# Patient Record
Sex: Male | Born: 1985 | Race: White | Hispanic: No | Marital: Married | Smoking: Never smoker
Health system: Southern US, Community
[De-identification: ages and names within clinical notes are randomized; demographics above are authoritative.]

---

## 2011-09-16 ENCOUNTER — Other Ambulatory Visit: Payer: Self-pay | Admitting: Internal Medicine

## 2011-09-16 DIAGNOSIS — R229 Localized swelling, mass and lump, unspecified: Secondary | ICD-10-CM

## 2011-09-18 ENCOUNTER — Other Ambulatory Visit: Payer: Self-pay

## 2011-09-19 ENCOUNTER — Other Ambulatory Visit: Payer: Self-pay

## 2011-09-26 ENCOUNTER — Ambulatory Visit
Admission: RE | Admit: 2011-09-26 | Discharge: 2011-09-26 | Disposition: A | Discharge status: Home / Self Care | Payer: PRIVATE HEALTH INSURANCE | Source: Ambulatory Visit | Attending: Internal Medicine | Admitting: Internal Medicine

## 2011-09-26 DIAGNOSIS — R229 Localized swelling, mass and lump, unspecified: Secondary | ICD-10-CM

## 2013-12-25 IMAGING — US US EXTREM UP*L* LTD
1 series · 14 of 19 positions shown · non-contrast
Comparison: None.

CLINICAL DATA: Probable area in the left upper arm

ULTRASOUND LEFT UPPER EXTREMITY COMPLETE
TECHNIQUE: Ultrasound examination of the left upper arm was
performed including evaluation of the muscles, tendons, joint, and
adjacent soft tissues.

[Series 1: us extrem up*left* ltd · 0.05mm/px · 14 of 19 slices shown]
[im 1/19]
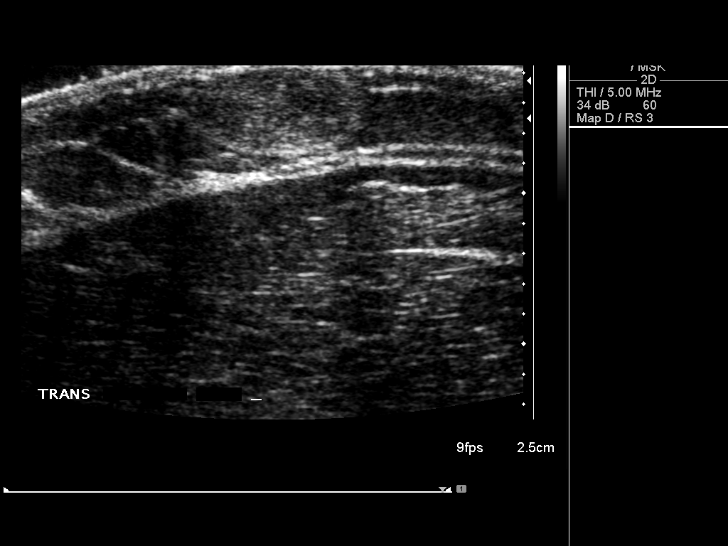
[im 3/19]
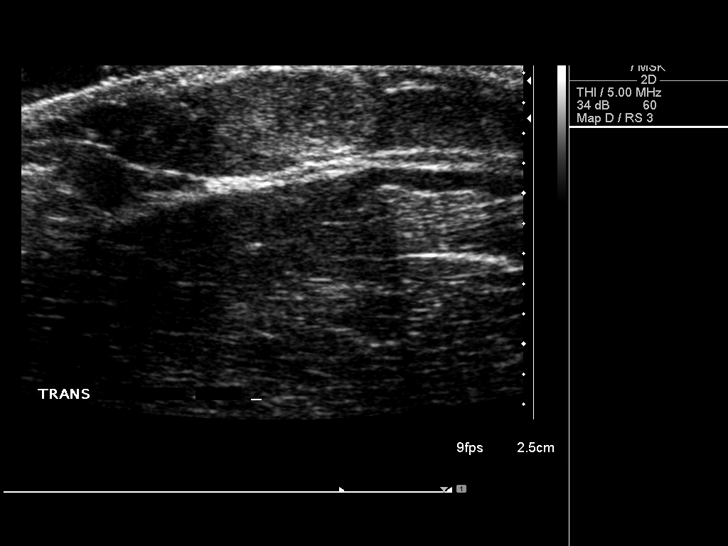
[im 4/19]
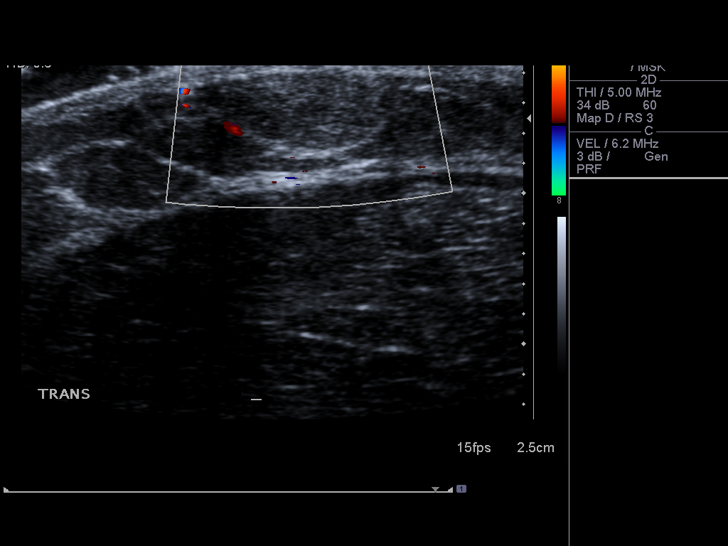
[im 5/19]
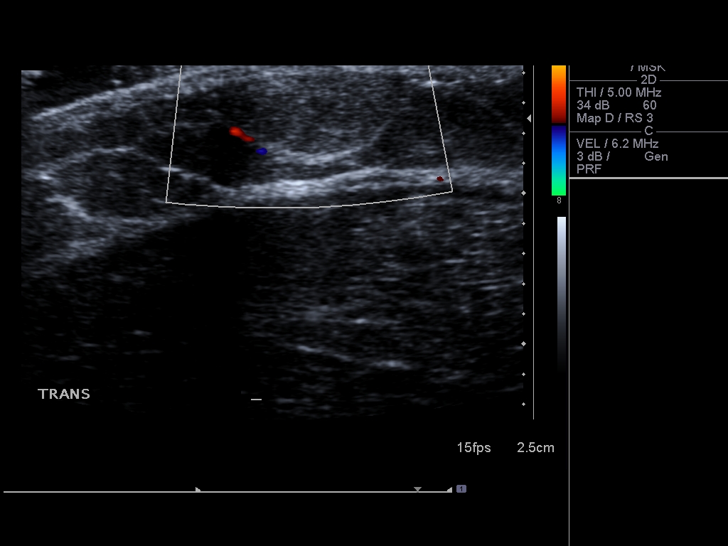
[im 7/19]
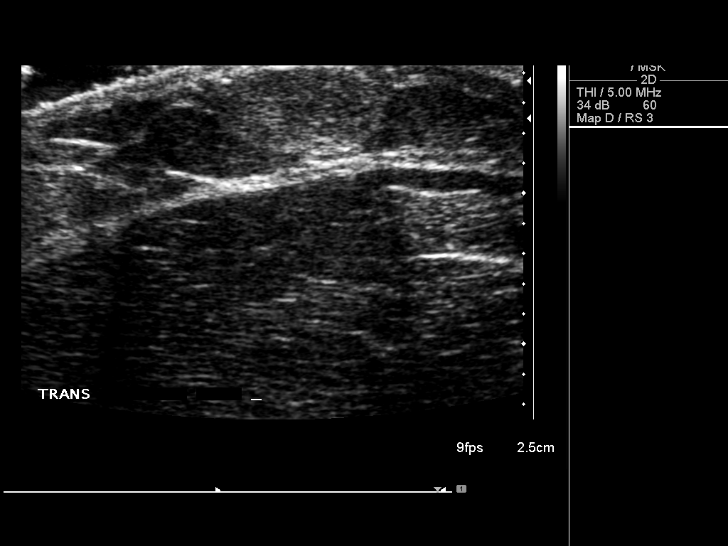
[im 8/19]
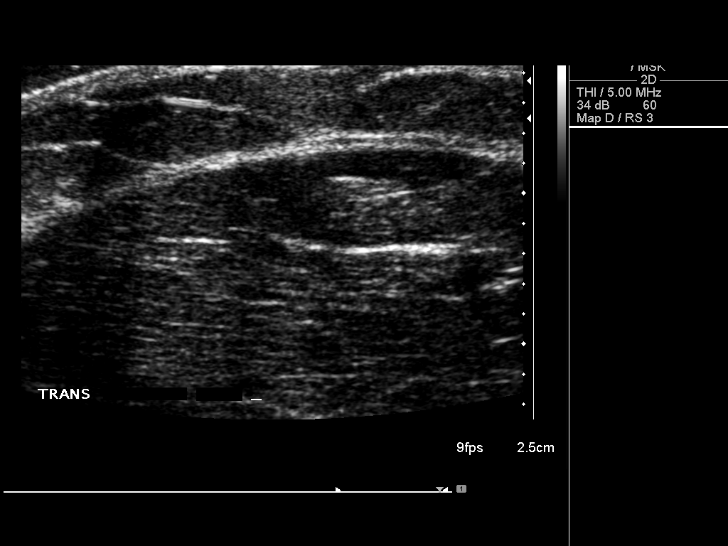
[im 9/19]
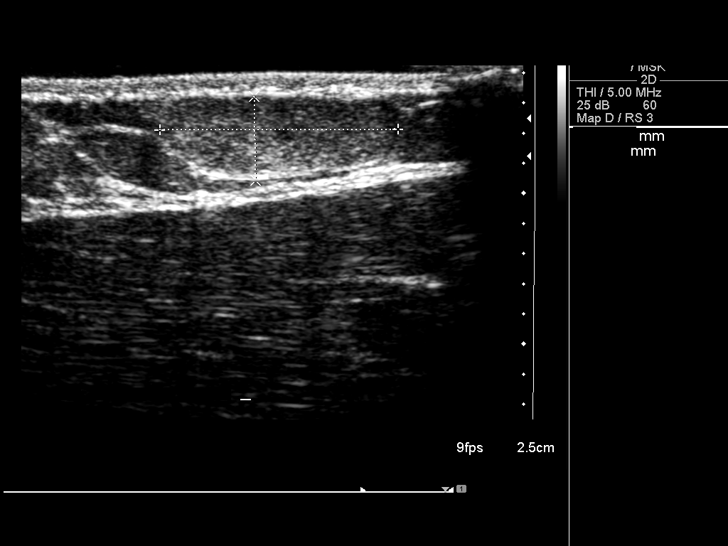
[im 11/19]
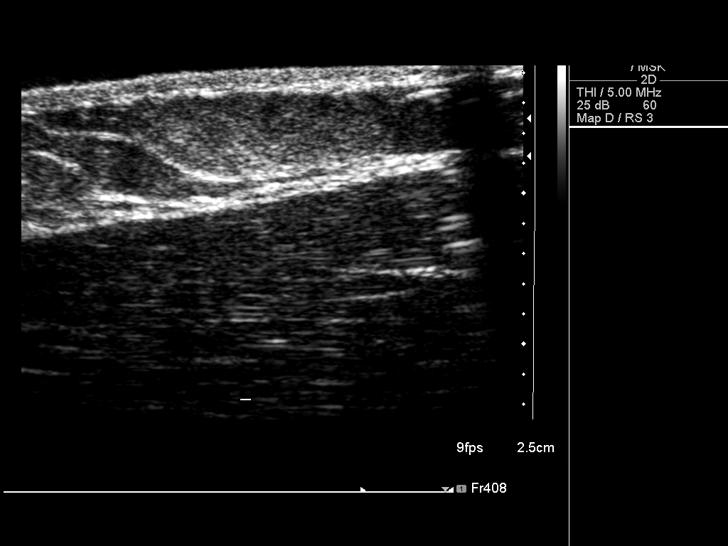
[im 12/19]
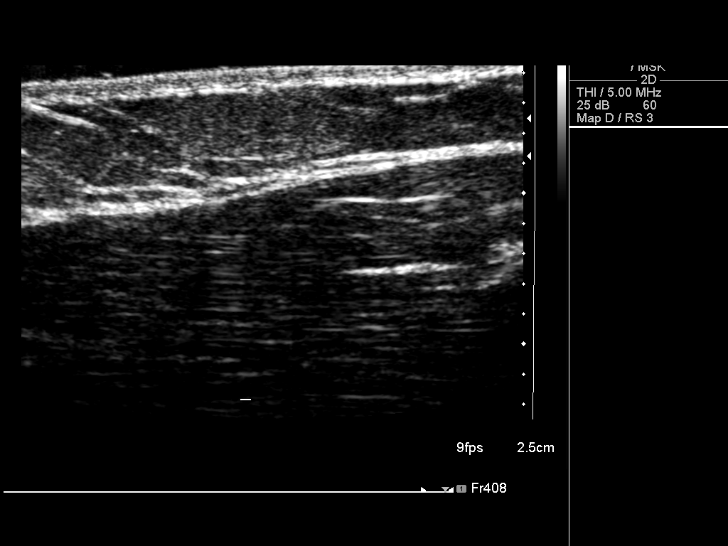
[im 13/19]
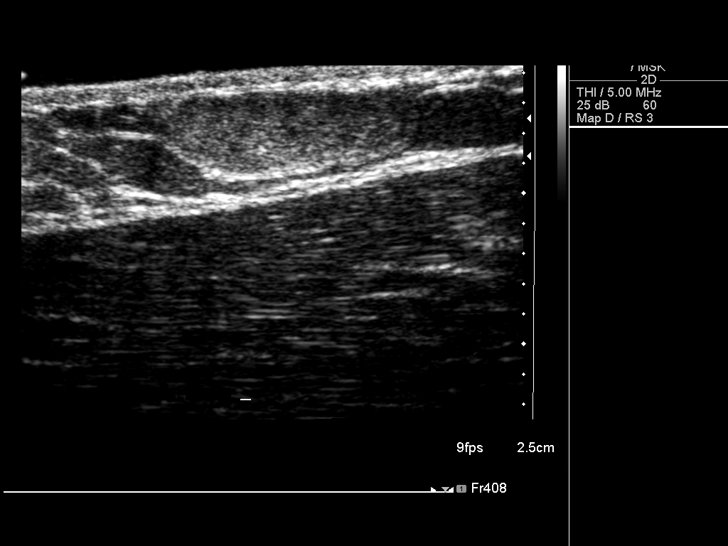
[im 15/19]
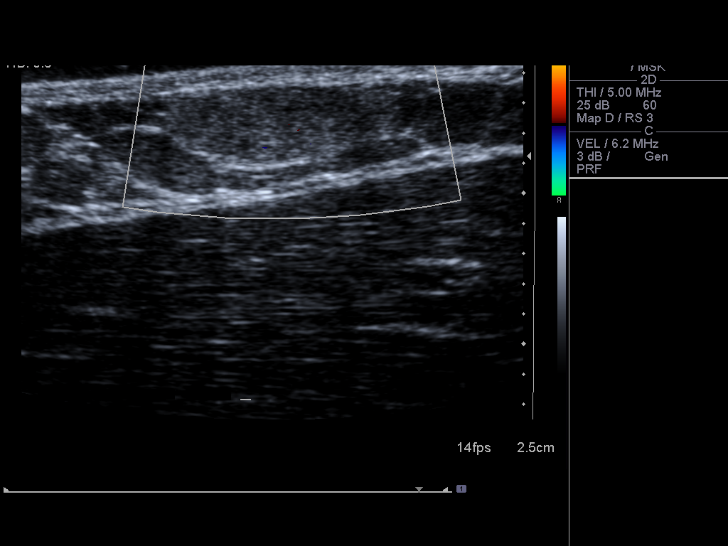
[im 16/19]
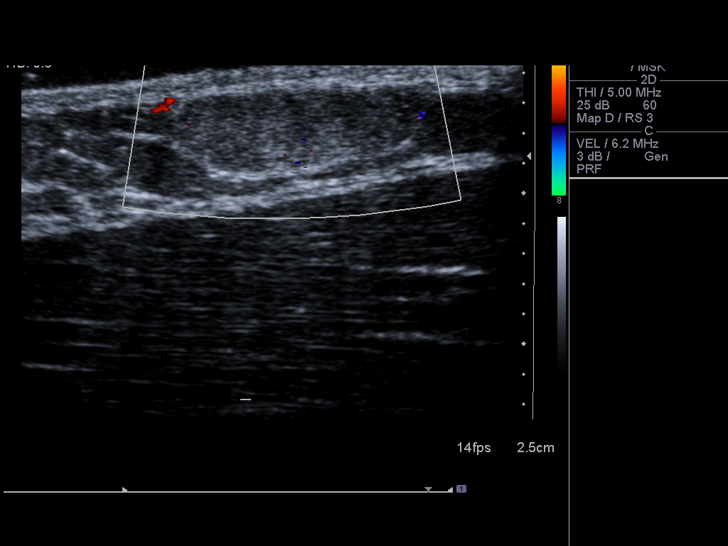
[im 17/19]
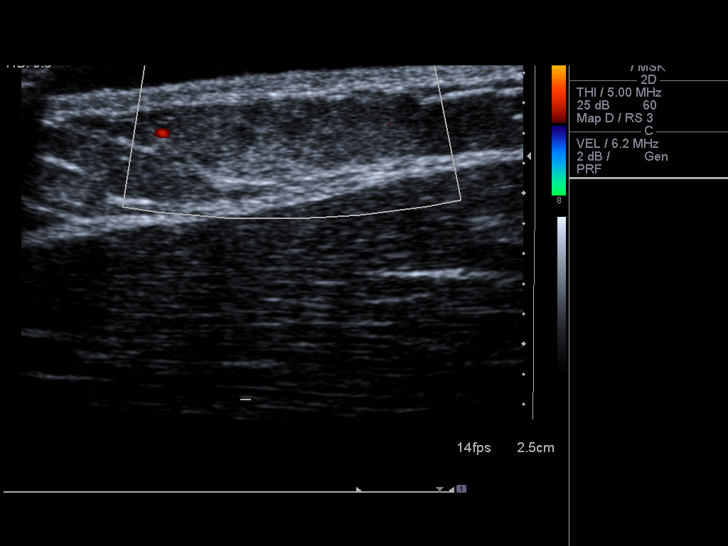
[im 19/19]
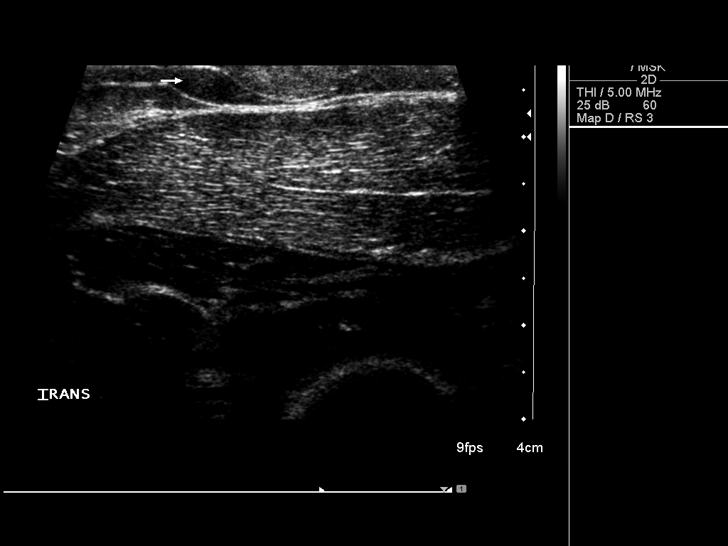

[14 of 19 positions shown; findings below may reference images not displayed]

FINDINGS: Ultrasound over the area in question was performed.  At
that site there is an oval slightly echogenic focus present of
x 0.6 x 1.2 cm most consistent with lipoma.  No worrisome features
are noted by ultrasound.
IMPRESSION: Probable lipoma within the soft tissues of the left upper arm.

## 2016-10-28 DIAGNOSIS — S838X1A Sprain of other specified parts of right knee, initial encounter: Secondary | ICD-10-CM | POA: Diagnosis not present

## 2016-12-03 DIAGNOSIS — Z0000 Encounter for general adult medical examination without abnormal findings: Secondary | ICD-10-CM | POA: Diagnosis not present

## 2016-12-03 DIAGNOSIS — Z13220 Encounter for screening for lipoid disorders: Secondary | ICD-10-CM | POA: Diagnosis not present

## 2016-12-03 DIAGNOSIS — M542 Cervicalgia: Secondary | ICD-10-CM | POA: Diagnosis not present

## 2016-12-03 DIAGNOSIS — E669 Obesity, unspecified: Secondary | ICD-10-CM | POA: Diagnosis not present

## 2016-12-03 DIAGNOSIS — E010 Iodine-deficiency related diffuse (endemic) goiter: Secondary | ICD-10-CM | POA: Diagnosis not present

## 2016-12-03 DIAGNOSIS — R6889 Other general symptoms and signs: Secondary | ICD-10-CM | POA: Diagnosis not present

## 2016-12-03 DIAGNOSIS — Z23 Encounter for immunization: Secondary | ICD-10-CM | POA: Diagnosis not present

## 2016-12-10 ENCOUNTER — Other Ambulatory Visit: Payer: Self-pay | Admitting: Family Medicine

## 2016-12-10 DIAGNOSIS — E010 Iodine-deficiency related diffuse (endemic) goiter: Secondary | ICD-10-CM

## 2016-12-17 ENCOUNTER — Ambulatory Visit
Admission: RE | Admit: 2016-12-17 | Discharge: 2016-12-17 | Disposition: A | Discharge status: Home / Self Care | Payer: BLUE CROSS/BLUE SHIELD | Source: Ambulatory Visit | Attending: Family Medicine | Admitting: Family Medicine

## 2016-12-17 DIAGNOSIS — E010 Iodine-deficiency related diffuse (endemic) goiter: Secondary | ICD-10-CM | POA: Diagnosis not present

## 2017-05-08 DIAGNOSIS — K219 Gastro-esophageal reflux disease without esophagitis: Secondary | ICD-10-CM | POA: Diagnosis not present

## 2017-12-04 DIAGNOSIS — Z0000 Encounter for general adult medical examination without abnormal findings: Secondary | ICD-10-CM | POA: Diagnosis not present

## 2017-12-04 DIAGNOSIS — K219 Gastro-esophageal reflux disease without esophagitis: Secondary | ICD-10-CM | POA: Diagnosis not present

## 2017-12-04 DIAGNOSIS — Z136 Encounter for screening for cardiovascular disorders: Secondary | ICD-10-CM | POA: Diagnosis not present

## 2018-12-08 DIAGNOSIS — Z0000 Encounter for general adult medical examination without abnormal findings: Secondary | ICD-10-CM | POA: Diagnosis not present

## 2018-12-15 DIAGNOSIS — Z0000 Encounter for general adult medical examination without abnormal findings: Secondary | ICD-10-CM | POA: Diagnosis not present

## 2018-12-15 DIAGNOSIS — Z13220 Encounter for screening for lipoid disorders: Secondary | ICD-10-CM | POA: Diagnosis not present

## 2019-03-17 DIAGNOSIS — Z20828 Contact with and (suspected) exposure to other viral communicable diseases: Secondary | ICD-10-CM | POA: Diagnosis not present

## 2019-03-30 DIAGNOSIS — Z20828 Contact with and (suspected) exposure to other viral communicable diseases: Secondary | ICD-10-CM | POA: Diagnosis not present

## 2020-02-13 DIAGNOSIS — R001 Bradycardia, unspecified: Secondary | ICD-10-CM | POA: Diagnosis not present

## 2020-02-13 DIAGNOSIS — Z6835 Body mass index (BMI) 35.0-35.9, adult: Secondary | ICD-10-CM | POA: Diagnosis not present

## 2020-02-13 DIAGNOSIS — K219 Gastro-esophageal reflux disease without esophagitis: Secondary | ICD-10-CM | POA: Diagnosis not present

## 2020-02-13 DIAGNOSIS — Z79899 Other long term (current) drug therapy: Secondary | ICD-10-CM | POA: Diagnosis not present

## 2020-02-13 DIAGNOSIS — R079 Chest pain, unspecified: Secondary | ICD-10-CM | POA: Diagnosis not present

## 2020-02-13 DIAGNOSIS — R0602 Shortness of breath: Secondary | ICD-10-CM | POA: Diagnosis not present

## 2020-02-13 DIAGNOSIS — R0789 Other chest pain: Secondary | ICD-10-CM | POA: Diagnosis not present

## 2020-03-08 DIAGNOSIS — R079 Chest pain, unspecified: Secondary | ICD-10-CM | POA: Diagnosis not present

## 2020-03-08 DIAGNOSIS — K219 Gastro-esophageal reflux disease without esophagitis: Secondary | ICD-10-CM | POA: Diagnosis not present

## 2020-03-08 DIAGNOSIS — E669 Obesity, unspecified: Secondary | ICD-10-CM | POA: Diagnosis not present

## 2020-03-30 ENCOUNTER — Other Ambulatory Visit: Payer: Self-pay

## 2020-03-30 ENCOUNTER — Ambulatory Visit (INDEPENDENT_AMBULATORY_CARE_PROVIDER_SITE_OTHER): Payer: BC Managed Care – PPO | Admitting: Family Medicine

## 2020-03-30 DIAGNOSIS — E669 Obesity, unspecified: Secondary | ICD-10-CM

## 2020-03-30 NOTE — Patient Instructions (Addendum)
Dietary objectives for decreasing GERD: 1. Minimize fat.  2. Distribute foods relatively evenly throughout the day to avoid especially large meals.   3. Avoid or minimize poorly tolerated foods (see handout provided today).   Read over the handout on sleep, and choose at least 3 of the recommended sleep hygiene behaviors to try for at least 2 weeks.    Use the Meal Planning Form to come up with some relatively easy-to-prepare dinner meals.     Specific Goals:  1. Continue walking 30-45 min at least 5 days a week AND do at least 20 minutes weight training 2 X wk.       - Document in your planner # of minutes walked and minutes of weight training.  At the end of each week, place a STAR to indicate you met your goal of 5 & 2 X wk.    2. Eat at least 3 REAL meals and 1-2 snacks per day.  Eat breakfast within one hour of getting up.  Aim for no more than 5 hours between eating.   A REAL meal includes at least some protein, some starch, and vegetables and/or fruit.   (OR: Would you serve this to a guest in your home, and call it a meal?). - Keep frozen vegetables on hand for a quick way to serve a veg.  (Suggestion: Microwave veg's, then add to canned soup.)  3. Obtain a vegetable serving at at least 10 meals per week.      (Vegetables at lunch might be added to soup; entree salad; bell pepper strips; leftover dinner vegetables,)  TASTE PREFERENCES ARE LEARNED.  This means it will get easier to choose foods you know are good for you if you are exposed to them enough.    Follow-up: In-office appt on Thursday, Dec 30 at 11:30 AM.   Ask your wife if she would like to join you at this appt.

## 2020-03-30 NOTE — Progress Notes (Signed)
Medical Nutrition Therapy Appt start time: 5361 end time: 1700 (1 hour+) Patient has completed 3rd dose of COVID-19 vaccine October 2021. Primary concerns today: Weight management.  Relevant history/background: Prather was referred by Marilynne Drivers, PA referral for MNT related to obesity (E66.9).  He has a h/o GERD that he would also like to address today.    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  He lives with his wife, and they share food prep responsibilities.  He had been making better food choices a couple years ago, but has had more difficulty during the COVID pandemic, and has gained ~20 lb in the past year.  Weight loss efforts pre-COVID included tracking food intake on phone app, measuring food portions, and exercising (although sporadically).  Learning Readiness: Ready     Usual eating pattern: 2-3 meals (usually bkfst and dinner) and 1-2 snacks per day. Frequent foods and beverages: water, 2 c coffee per day (blk or with 1/2 c oat milk and 1-2 tbsp honey), beer 3-4 X wk; deli meat sandwiches, eggs, restaurant or takeout ~4 X wk.  Avoided foods: some foods that make GERD worse (although he is not sure which are consistently triggering) Usual physical activity: Walks his dog 30-45 min ~5 X wk and 20 minutes weight training with DBs at home sporadically (0-2 X wk).   Sleep: Estimates he gets 6 hrs of sleep/night weekdays (~7 hrs on weekends).  Usually falls asleep between 1 and 3 AM, although goes to bed ~12 AM.  Experiences insomnia intermittently.    24-hr recall: (Up at 8 AM; drank water) B ( AM)-   1 c coffee, 1/2 c oat milk, ~1/2 tbsp sugar Snk ( AM)-   --- L (11 PM)-  ?Thanksgiving leftovers? ([place], mac&chs, gravy?), water Snk (2 PM)-  1 c coffee, 1/2 c oat milk, ~1/2 tbsp sugar, 3/4 c peanuts, water  D (6 PM)-  1/2 frozen spinach pizza, beer Snk (7 PM)-  1 c mac&chs, water Typical day? Yes.   although could not remember yesterday's intake for  sure.    Nutritional Diagnosis:  NB-1.1 Food and nutrition-related knowledge deficit As related to weight and reflux management.  As evidenced by expressed frustration at not being able to lose weight or control GERD symptoms.  Handouts given during visit include:  After-Visit Summary (AVS)  GERD handout  Sleep handout  Meal Planning Form  Demonstrated degree of understanding via:  Teach Back  Barriers to learning/adherence to lifestyle change: Longstanding poor eating behaviors.   Monitoring/Evaluation:  Dietary intake, exercise, and body weight in 4 week(s).

## 2020-04-27 ENCOUNTER — Other Ambulatory Visit: Payer: Self-pay

## 2020-04-27 ENCOUNTER — Ambulatory Visit (INDEPENDENT_AMBULATORY_CARE_PROVIDER_SITE_OTHER): Payer: BC Managed Care – PPO | Admitting: Family Medicine

## 2020-04-27 DIAGNOSIS — E669 Obesity, unspecified: Secondary | ICD-10-CM | POA: Diagnosis not present

## 2020-04-27 NOTE — Progress Notes (Signed)
Telehealth Encounter; changed to remote visit b/c patient was exposed to COVID-positive person earlier in the week, although he was negative on a home test yesterday.   (Email link to [email or web address] ) I connected with Bradley Harrison (MRN [id number]) on 04/27/2020 by MyChart video-enabled telemedicine application, verified that I was speaking with the correct person using two identifiers, and that the patient was in a private environment conducive to confidentiality.  The patient agreed to proceed.  We lost connection early in the appt, so finished by phone.    Persons participating in visit were patient and provider (registered dietitian) Linna Darner, PhD, RD, LDN, CEDRD.  Provider was located at [facility] during this telehealth encounter; patient was at home.  Appt start time: 1130 end time: 1250 (1+ hour) PCP Horton Marshall, PA; [facility] Patient has completed 3rd dose of COVID-19 vaccine in October 2021. Reason for telehealth visit: Referred by Horton Marshall, PA referral for MNT related to obesity (E66.9).  He also has a h/o GERD that he would also like to address.    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  Lives with his wife with whom he shares food preparation responsibilities.    Assessment:  Chinonso has been tracking progress on goals using a planner, and feels he has been mostly successful.  Planning meals and shopping remains challenging.  He has not completed the Meal Planning Form, but has tried some of the suggestions from the sleep handout provided - more intentional about staying away from electronics or stressors before bed.  He has identified some factors that exacerbate GERD, especially beer and eating too close to bedtime.   Recent eating pattern: 3 meals and 1-2 snacks per day.  Getting vegetables  at lunch/dinner 10-12 X wk.   Usual physical activity: Walks dog 30-40 min 4-5 X wk and 20 minutes weight training with DBs at home 1 X wk, although he aims for strength workouts on Tue & Fri (uses same workout - biceps curls and shoulder presses).   Sleep: ~6 hrs of sleep/night weekdays (~7 on weekends).  Usually falls asleep between 1 and 3 AM, although goes to bed ~12 AM.     Nutritional Diagnosis: Some progress noted on NB-1.1 Food and nutrition-related knowledge deficit as related to weight and reflux management as evidenced by increased awareness of factors contributing to GERD.   Barriers to learning/adherence to lifestyle change: Longstanding poor eating behaviors.   Intervention: Completed diet and exercise history, and reviewed principles of establishing health behavior habits and tangible ways to use these principles.   For recommendations and goals, see Patient Instructions.    Monitoring/Evaluation:  Dietary intake, exercise, and body weight in 4 week(s).  Follow-up: In-office appt in 4 weeks  Josslyn Ciolek,[name]

## 2020-04-27 NOTE — Patient Instructions (Addendum)
Willpower:  Those who are most successful at maintaining good health behaviors make HABITS of those behaviors.  Establishing healthy habits takes more than willpower or self-discipline.  If you are trying to START doing some behavior, you want to make it as easy and enjoyable as possible.  If you want to STOP a behavior, it'll be easier to achieve success if you make that behavior more conscious, more difficult, and less enjoyable.  Give some thought as to what you can put in place to increase your chance of success.  Talk to with your wife about this for other ideas.    - Continue to practice your good bedtime routine that includes avoiding electronics for the 30 minutes before bedtime and reading during this time.   - Complete the Meal Planning Form to come up with some relatively easy-to-prepare dinner meals.   Set aside a few minutes once a week to determine at least a loose plan for meals in the coming week, using your meal planning form as a basis for shopping.  Specific Goals:  1. Continue walking 30-45 min at least 5 days a week AND do at least 20 minutes weight training 2 X wk.       - What will make workouts more appealing and convenient for you?  - Listen to music or podcasts.         - Reminders to do your workout, such as get DBs out the night before/morning of workout days, phone notifications, enlist wife to remind you.    - Put your workout time in your calendar with a notification.    - Create more variety in your workouts.  Plan some in advance, and write them down.  Keep a list of workouts in a notebook or on index cards or in a digital file.  Exercises to include might be: biceps curls, shoulder presses, pushups (wall, knee, regular), lunges, squats, situps, Russian twists with DB.       - Document in your planner # of minutes walked and minutes of weight training using a colored pen!  At the end of each week, place/draw a STAR to indicate you met your goal of 5 & 2 X wk.    2. Eat  at least 3 REAL meals and 1-2 snacks per day.  Eat breakfast within one hour of getting up.  Aim for no more than 5 hours between eating.   A REAL meal includes at least some protein, some starch, and vegetables and/or fruit.   (OR: Would you serve this to a guest in your home, and call it a meal?).  3. Obtain a vegetable serving at at least 10 meals per week.    Follow-up: In-office appt on Monday, January 24 at 11 AM.

## 2020-05-22 ENCOUNTER — Other Ambulatory Visit: Payer: Self-pay

## 2020-05-22 ENCOUNTER — Ambulatory Visit (INDEPENDENT_AMBULATORY_CARE_PROVIDER_SITE_OTHER): Payer: BC Managed Care – PPO | Admitting: Family Medicine

## 2020-05-22 DIAGNOSIS — E669 Obesity, unspecified: Secondary | ICD-10-CM | POA: Diagnosis not present

## 2020-05-22 NOTE — Patient Instructions (Addendum)
-   Consider ways to make it easier for you to follow through with intended behaviors, for example:     - Keep frozen vegetables on hand for a quick, easy vegetable serving (broccoli, sugar snap peas, green beans, cauliflower, etc.      - Fresh veg's that keep a long time: Carrots, Brussels sprouts, cabbage, turnips.        - Complete the Meal Planning Formto design some relatively easy-to-prepare dinner meals.     - Set aside a few minutes once a week to determine at least a loose plan for meals in the coming week, using your meal planning form as a basis for shopping.  Specific Goals:  1. Continue walking 30-45 min at least 5 days a weekAND do at least 20 minutes weight training 2 X wk.       - I still encourage you to plan (write down) some workouts in advance.  Keep a list of workouts in a notebook or on index cards or in a digital file.  You can get a lot of ideas online.   - Documentin your planner # of minutes walked and minutes of weight training.  2. Before choosing a snack, decide what you really want:  Ask ALL THREE of the following questions (not one or two): 1. What am I in the mood for?  (Consider options.) 2. How hungry am I? 3. What's good for me? Keep in mind that "what is good for you" can be interpreted in very different ways depending on the circumstances.  At one time the answer might be an apple, while at another time, the answer might be a scoop of ice cream.  The best food decision includes optimizing satisfaction.  Once you choose your snack, put it on a plate, sit down, and give it full attention as you enjoy it.  Document in some way each time you make a mindful snack decision using the above process.   3. Obtain a vegetable serving at at least 10 meals per week.   Follow-up: Telehealth appt on Monday, Feb 28 at 11 AM.

## 2020-05-22 NOTE — Progress Notes (Signed)
Telehealth Encounter  (Email link to [email or web address] ) I connected with Bradley Harrison (MRN [id number]) on 05/22/2020 by MyChart video-enabled, HIPAA-compliant telemedicine application, verified that I was speaking with the correct person using two identifiers, and that the patient was in a private environment conducive to confidentiality.  The patient agreed to proceed.  Persons participating in visit were patient and provider (registered dietitian) Linna Darner, PhD, RD, LDN, CEDRD.  Provider was located at [facility] during this telehealth encounter; patient was at home.  Appt start time: 1100 end time: 1200 (1 hour) PCP Horton Marshall, PA; [facility] Patient has completed 3rd dose of COVID-19 vaccine in October 2021. Reason for telehealth visit: Referred by Horton Marshall, PA referral for MNT related to obesity (E66.9).  He also has a h/o GERD that he would also like to address.    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  Lives with his wife with whom he shares food preparation responsibilities.    Assessment:  Bradley Harrison and his wife have given some thought to meals they can plan, but have not yet completed the Meal Planning form.  He has kept his weights out where he is reminded to follow through.  He has been documenting walking and weight workouts as well as vegetable intake.   Weight 187 lb (self-reported), up a couple pounds in past 2 weeks.   Recent eating pattern: 3 meals and 1-2 snacks per day.  Getting vegetables at lunch/dinner 6 X wk.   Usual physical activity: Weight lifting 2 X wk, walking 4 X wk.   Sleep: 6-8 hrs of sleep/night weekdays (~7 on weekends).  Usually getting to bed between 11 PM & 12 AM. 24-hr recall:  (Up at 8 AM - drank water) B (10 AM)-  1 1/2 pkt inst grits, goat cheese, 1 fried egg,  1/4 c canned crab, 1/2 c  sauteed kale, coffee, 2 tsp simple syrup, 2 tbsp oat milk Snk ( AM)-  water L (1 PM)-  1 [place] (2 oz) & cheddar (1 oz) sandwich, water Snk ( PM)-  2-3 choc candies, water D (7 PM)-  Takeout: [place] dumplings (1 pork, 1 veg), 2 pork skewers, 16 oz boda tea Snk (9 PM)-  2 GS peanut butter cookies Typical day? Yes.   Except takeout is usually only 2-3 times a week, and most weekdays breakfast has been yogurt or oatmeal and fruit.    Nutritional Diagnosis:  Some progress noted on NB-1.1 Food and nutrition-related knowledge deficit as related to weight and reflux management as evidenced by more consistent physical activity and continued interest in refining eating behaviors.    Barriers to learning/adherence to lifestyle change: Longstanding poor eating behaviors.   Intervention: Completed diet and exercise history, and confirmed behavioral goals.   For recommendations and goals, see Patient Instructions.    Follow-up: In-office appt in 5 weeks  Lark Runk,[name]

## 2020-06-21 DIAGNOSIS — L720 Epidermal cyst: Secondary | ICD-10-CM | POA: Diagnosis not present

## 2020-06-26 ENCOUNTER — Other Ambulatory Visit: Payer: Self-pay

## 2020-06-26 ENCOUNTER — Encounter: Payer: BC Managed Care – PPO | Admitting: Family Medicine

## 2020-06-26 NOTE — Progress Notes (Signed)
This encounter was created in error - please disregard.

## 2020-06-29 ENCOUNTER — Other Ambulatory Visit: Payer: Self-pay

## 2020-06-29 ENCOUNTER — Ambulatory Visit (INDEPENDENT_AMBULATORY_CARE_PROVIDER_SITE_OTHER): Payer: BC Managed Care – PPO | Admitting: Family Medicine

## 2020-06-29 DIAGNOSIS — E669 Obesity, unspecified: Secondary | ICD-10-CM

## 2020-06-29 NOTE — Progress Notes (Deleted)
Bradley Harrison cancelled appt from 1/28)***  Medical Nutrition Therapy PCP Horton Marshall, PA; [facility] Patient has completed 3rd dose of COVID-19 vaccine in October 2021. Appt start time: 1130 end time: 1230 (1 hour) Primary concerns today: Referred by Horton Marshall, PA for MNT related to obesity (E66.9).  He also has a h/o GERD that he would like to address.   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  He has had difficulty managing his weight during the COVID pandemic, having gained ~20 lb in the past year.  Lives with his wife with whom he shares food preparation responsibilities.     Assessment:  Bradley Harrison *** Weight: ***187 lb (self-reported), ***.   Recent eating pattern: 3 meals and 1-2 snacks per day.  Getting vegetables at lunch/dinner ***6 X wk.   Usual physical activity: ***Weight lifting 2 X wk, walking 4 X wk.   Sleep: ***6-8 hrs of sleep/night weekdays (~7 on weekends).  Usually getting to bed between 11 PM & 12 AM.  24-hr recall: (Up at  AM) B ( AM)-    Snk ( AM)-    L ( PM)-   Snk ( PM)-   D ( PM)-   Snk ( PM)-   Typical day? {yes [id number]  Nutritional Diagnosis:  *** progress noted on NB-1.1 Food and nutrition-related knowledge deficit as related to weight and reflux management as evidenced by ***more consistent physical activity and continued interest in refining eating behaviors.    Barriers to learning/adherence to lifestyle change: Longstanding poor eating behaviors.   Intervention: Completed diet and exercise history, and confirmed behavioral goals.   For recommendations and goals, see Patient Instructions.    Follow-up: In-office appt in *** weeks  SYKES,[name]  Pt Instrxns from 1/24 *** - Consider ways to make it easier for you to follow through with intended behaviors, for example:     - Keep frozen vegetables on hand for a quick, easy vegetable serving (broccoli, sugar snap peas, green beans, cauliflower,  etc.      - Fresh veg's that keep a long time: Carrots, Brussels sprouts, cabbage, turnips.        - Complete the Meal Planning Form to design some relatively easy-to-prepare dinner meals.       - Set aside a few minutes once a week to determine at least a loose plan for meals in the coming week, using your meal planning form as a basis for shopping.   Specific Goals:  1. Continue walking 30-45 min at least 5 days a week AND do at least 20 minutes weight training 2 X wk.        - I still encourage you to plan (write down) some workouts in advance.  Keep a list of workouts in a notebook or on index cards or in a digital file.  You can get a lot of ideas online.       - Document in your planner # of minutes walked and minutes of weight training.   2. Before choosing a snack, decide what you really want:  Ask ALL THREE of the following questions (not one or two): 1. What am I in the mood for?  (Consider options.) 2. How hungry am I? 3. What's good for me? Keep in mind that "what is good for you" can be interpreted in very different ways depending on the circumstances.  At one time the answer might be an apple, while at another time, the answer  might be a scoop of ice cream.  The best food decision includes optimizing satisfaction.  Once you choose your snack, put it on a plate, sit down, and give it full attention as you enjoy it.  Document in some way each time you make a mindful snack decision using the above process.    3. Include a vegetable serving in at least 10 meals per week.     Follow-up: In-office appt on ***

## 2020-06-29 NOTE — Patient Instructions (Addendum)
-   Complete the Meal Planning Form, including the page of three columns of what foods you like.    - When snacking, always put it on a plate, sit down, and give it full attention as you enjoy it.   Specific Goals:  1. Continue walking 30-45 min at least 5 days a week AND do at least 20 minutes weight training 2 X wk.        - I still encourage you to plan (write down) some workouts in advance.  Keep a list of workouts in a notebook or on index cards or in a digital file.  You can get a lot of ideas online.       - Document in your planner # of minutes of weight training each week.  2. Set aside a few minutes once a week to determine at least a loose plan for meals in the coming week, using your meal planning form as a basis for shopping.  Set a phone alert to remind you to do your meal planning.  3. Include a vegetable serving in at least 10 meals per week.      - Keep frozen veg's on hand for a quick, easy vegetable serving (broccoli, sugar snap peas, green beans, cauliflower, etc).     - Fresh veg's that keep a long time: Carrots, Brussels sprouts, cabbage, turnips.       - Include variety in your vegetables.      - Document when you have veg's in your planner along with your weight training documentation.    Documentation:  You may want to try the Goal Tracker form provided today.    Follow-up In-office appt Thursday, April 28 at 2 PM.    If Qs, contact [name] at [phone] or [email or web address].

## 2020-06-29 NOTE — Progress Notes (Signed)
Medical Nutrition Therapy PCP Horton Marshall, PA; [facility] Patient has completed 3rd dose of COVID-19 vaccine in October 2021. Appt start time: 1130 end time: 1230 (1 hour) Primary concerns today: Referred by Horton Marshall, PA for MNT related to obesity (E66.9).  He also has a h/o GERD that he would like to address.   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  He has had difficulty managing his weight during the COVID pandemic, having gained ~20 lb in the past year.  Lives with his wife with whom he shares food preparation responsibilities.     Assessment:  Rylei and his wife have designed some meals using his Meal Planning form, but he has not listed preferred foods, nor have they set aside time to plan meals for the upcoming week.  Although Nivaan feels this would be helpful, it is hard to coordinate their schedules to accomplish it. Jayion's weight is up nearly 7 lb since Dec 2021, and he is unsure why, especially in light of his increased exercise.  Diet history suggests he is still obtaining inadequate vegetables, and many meals are relatively high-kcal.  He agreed today that advance planning would likely help ensure that meals are more balanced and appropriate in kcal.   Weight: 192.0 lb.   Recent eating pattern: 3 meals and 0-1 snack per day.  Getting vegetables at lunch/dinner ~7 X wk.   Usual physical activity: Usually weight training 2 X wk, walking 30-45 min 5-6 X wk.   Sleep: 6-8 hrs of sleep/night weekdays (~7 on weekends).    24-hr recall: (Up at 8 AM) B (9 AM)-   1 c blk coffee, water B (9:30 AM)- 2 1/2 scrmbld eggs, 1 1/2 slc toast, 1 tsp butter Snk ( AM)-   --- L (1 PM)-  2 c rice, chx, cheese, & spinach, water Snk ( PM)-  --- D (7:30 PM)-  Large Mexican sandw: chorizo, tom, pep's, mayo, 1 c refried beans, water Snk ( PM)-  --- Typical day? Yes, although usually has restaurant/takeout food only ~4 X wk.   Nutritional Diagnosis:   Stable progress noted on NB-1.1 Food and nutrition-related knowledge deficit as related to weight and reflux management as evidenced by continued physical activity, despite limited change in eating behaviors.    Barriers to learning/adherence to lifestyle change: Longstanding poor eating behaviors.   Intervention: Completed diet and exercise history, confirmed behavioral goals, and discussed incorporating more vegetables, especially those prepared with minimal or no fat.     For recommendations and goals, see Patient Instructions.    Follow-up: In-office appt in 8 weeks.  Monty Mccarrell,[name]

## 2020-08-21 DIAGNOSIS — Z20822 Contact with and (suspected) exposure to covid-19: Secondary | ICD-10-CM | POA: Diagnosis not present

## 2020-08-21 DIAGNOSIS — U071 COVID-19: Secondary | ICD-10-CM | POA: Diagnosis not present

## 2020-08-24 ENCOUNTER — Ambulatory Visit: Payer: BC Managed Care – PPO | Admitting: Family Medicine

## 2020-10-02 ENCOUNTER — Other Ambulatory Visit: Payer: Self-pay

## 2020-10-02 ENCOUNTER — Encounter: Payer: Self-pay | Admitting: Family Medicine

## 2020-10-02 ENCOUNTER — Ambulatory Visit (INDEPENDENT_AMBULATORY_CARE_PROVIDER_SITE_OTHER): Payer: BC Managed Care – PPO | Admitting: Family Medicine

## 2020-10-02 DIAGNOSIS — E669 Obesity, unspecified: Secondary | ICD-10-CM

## 2020-10-02 NOTE — Patient Instructions (Signed)
-   If you write down the actual workouts you do, you will develop a catalogue of workouts, which may make it easier/more convenient in the future.    Specific Goals:  1. Walk 30-45 min at least 5 days a weekAND do at least 20 minutes weight training 2 X wk. - Documentin your planner # of minutes of weight training each week. 2. Set aside a few minutes once a week to plan dinner meals in the coming week, using your meal planning form as a basis for shopping.  Schedule this time with your wife, and set a phone alert to remind you to do your meal planning.  Use your Meal Planning form.  Think about how you'll incorporate a lot of veg's to meals.   3. Include a vegetable serving in >10 meals per week.  - Be generous in the serving size(s) of your veg's.    - Aim for <2 beers per time; alternate beer with water or seltzer drinks.    Documentation:  Continue to track in your planner.    Follow-up In-office appt on Thursday, July 7 at 2:30 PM.

## 2020-10-02 NOTE — Progress Notes (Signed)
Medical Nutrition Therapy PCP Bradley Marshall, PA; [facility] Patient has completed 3rd dose of COVID-19 vaccine in October 2021. Appt start time: 1130 end time: 1230 (1 hour) Primary concerns today: Referred by Bradley Marshall, PA for MNT related to obesity (E66.9).  He also has a h/o GERD that he would like to address.   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  He has had difficulty managing his weight during the COVID pandemic, having gained ~20 lb in the past year.  Lives with his wife with whom he shares food preparation responsibilities.     Assessment:  Bradley Harrison's COVID infection in late-March resulted in fatigue for several weeks, so he has just started back to exercise recently.  Has not been planning meals consistently,a nd feels this is where he most needs to focus his efforts.  Estimates he's getting 4-5 rest/takeout meals/week.  In the past couple weeks, Bradley Harrison has had veg's 2 X day most days, however. Bradley Harrison has been documenting minutes of exercise and veg intake.   Weight: 283.8 lb, down 8 lb since March.   Recent eating pattern: 3 meals and 0-1 snack per day.  Getting vegetables at lunch/dinner 7 X wk.   Usual physical activity: Weight training ~40 min 2 X wk, walking 30-45 min 4-5 X wk.  Has been using online workout plans.   Sleep: 6-8 hrs of sleep/night weekdays (~7 on weekends).   24-hr recall:  (Up at 9 AM; drank water) B (11 AM)-  4 soft corn tacos, 1 c soy chorizo, avocado, 2 1/2 scrmbld eggs, 1 c coffee, 1-2 tbsp cream, water Snk ( AM)-  water L ( PM)-  --- Snk ( PM)-  --- D (6 PM)-  9 seitan "wings," 4 tbsp bl chs drsng, a few celery&carrots, 36 oz beer, water Snk ( PM)-  --- Typical day? No.  Out with friends for dinner; doesn't usually drink that much beer.  Weekdays usually has 3 meals; bkfst is usually inst oatmeal with fruit or eggs with 1 bread.  Lunch on weekdays is dinner leftovers or salad and/or sandwich.     Nutritional Diagnosis:  Continued progress noted on NB-1.1 Food and nutrition-related knowledge deficit as related to weight and reflux management as evidenced by nearly meeting physical activity goals and consistently including veg's at meals.      Barriers to learning/adherence to lifestyle change: Longstanding poor eating behaviors.   Intervention: Completed diet and exercise history, confirmed behavioral goals, and encouraged continued efforts.    For recommendations and goals, see Patient Instructions.    Follow-up: In-office appt in 4 weeks.  Bradley Harrison,[name]

## 2020-11-02 ENCOUNTER — Other Ambulatory Visit: Payer: Self-pay

## 2020-11-02 ENCOUNTER — Ambulatory Visit (INDEPENDENT_AMBULATORY_CARE_PROVIDER_SITE_OTHER): Payer: BC Managed Care – PPO | Admitting: Family Medicine

## 2020-11-02 DIAGNOSIS — E669 Obesity, unspecified: Secondary | ICD-10-CM

## 2020-11-02 NOTE — Progress Notes (Signed)
Telehealth Encounter for Medical Nutrition Therapy PCP Bradley Marshall, PA; [facility] Patient has completed 3rd dose of COVID-19 vaccine in October 2021. Appt start time: 1430 end time: 1530 (1 hour) I connected with Bradley Harrison (MRN [id number]) on 11/02/2020 by MyChart video-enabled, HIPAA-compliant telemedicine application, verified that I was speaking with the correct person using two identifiers, and that the patient was in a private environment conducive to confidentiality.  The patient agreed to proceed.  Reason for telehealth visit: Referred by Bradley Marshall, PA for MNT related to obesity (E66.9).  He also has a h/o GERD that he would like to address.   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  He has had difficulty managing his weight during the COVID pandemic, having gained ~20 lb in the past year.  Lives with his wife with whom he shares food preparation responsibilities.     Persons participating in visit were patient and provider (registered dietitian) Linna Darner, PhD, RD, LDN, CEDRD.  Provider was located at [facility] during this telehealth encounter; patient was at home.  Assessment:  Bradley Harrison has had some demanding home projects, which have limited his time for exercise.  In addition, he has had car problems that have made grocery shopping more difficult.  He is trying to get back to his weight training routine as well as walking, although will aim for walking 3 X wk instead of 5 b/c of the heat.  Bradley Harrison would like to continue to work on the same goals previously established, and will plan to document progress on the Goals Sheet provided.   Weight: 285 lb (self-report), stable past few months.   Recent eating pattern: 3 meals and 0-1 snack per day.   Usual physical activity: Has fallen off his weight training; has walked the dog 15-20 min 2-3 X wk.  Sleep: 6-8 hrs of sleep/night weekdays (~7 on weekends).   24-hr  recall:  (Up at 8 AM) B (9 AM)-  1 pkt flavored inst oatmeal, 1 banana, 1 c coffee, 2 tsp honey, 2 tbsp oat milk, water Snk ( AM)-  --- L (11 AM)-  Chx sandwich, 1/2 slc Cheddar cheese, mustard, 1/2 c sauerkraut, 1 c coffee,  2 tsp honey, 2 tbsp oat milk, water Snk (3 PM)-  Choc-covered ginger, water D ( PM)-  ??? Could not remember. Snk ( PM)-  --- Typical day? Yes.   Currently out of vegetables.    Nutritional Diagnosis:  Regression noted on NB-1.1 Food and nutrition-related knowledge deficit as related to weight and reflux management as evidenced by reduced exercise time and not meeting goal of veg's 10 X wk.    Intervention: Completed diet and exercise history, modified behavioral goals and Goals Sheet.    For recommendations and goals, see Patient Instructions.    Follow-up: In-office appt in 6 weeks.  Doren Kaspar,[name]

## 2020-11-02 NOTE — Patient Instructions (Addendum)
Keep some frozen vegetables on hand for times you run out.    Goals remain essentially the same: 1. Walk 30-45 min at least 3 days a week AND do at least 20 minutes weight training 2 X wk, aiming for lunch time for weight training.        - Document in your planner # of minutes of weight training each week.     - Reminder from 10/02/20: If you write down the workouts you do, you will develop a catalogue of workouts, which may make it easier/more convenient in the future.    2. Set aside a few minutes once a week to plan dinner meals in the coming week, using your meal planning form, including making a list of groceries needed.  Alternate this weekly responsibility with your wife, and set a phone alert to remind you to do the meal planning.    3. Include a vegetable serving in >10 meals per week.      - Be generous in the serving size(s) of your veg's.    Documentation:  Track progress on your goals on the Goals Sheet provided today.  Bring your Goals Sheet to your follow-up appt.    Follow-up In-office appt on Thursday, August 18 at 3 PM.

## 2020-12-14 ENCOUNTER — Ambulatory Visit: Payer: BC Managed Care – PPO | Admitting: Family Medicine

## 2020-12-19 ENCOUNTER — Ambulatory Visit (INDEPENDENT_AMBULATORY_CARE_PROVIDER_SITE_OTHER): Payer: BC Managed Care – PPO | Admitting: Family Medicine

## 2020-12-19 ENCOUNTER — Encounter: Payer: Self-pay | Admitting: Family Medicine

## 2020-12-19 ENCOUNTER — Other Ambulatory Visit: Payer: Self-pay

## 2020-12-19 DIAGNOSIS — E669 Obesity, unspecified: Secondary | ICD-10-CM

## 2020-12-19 NOTE — Patient Instructions (Addendum)
-   Schedule in your calendar with an alert to place your Goals Sheet on top of your keyboard at the end of each workday, so it's easier to remember to document.   - Weekly planning is just as important while you are without a kitchen as at other times.  There are plenty of places you can get vegetables as part of a meal.  You might want to make a list of restaurants you know offer veg's.  Also, what are "grab-and-go" veg's (carrots, celery, cucumbers, small tomatoes) you can keep on hand to accompany a sandwich, for example?  And you can microwave frozen veg's to go with takeout.    Goals remain essentially the same: 1. Walk 30-45 min at least 5 days a week AND strength training that includes the following: 3 exercises 3 days a week, such as:   A. 3 rounds of: 12 squats; 10 bent-knee sit-ups; 15 bent-over flies.   B. 3 rounds of: 10 lunges (each side); 10-sec planks; 5 bent-over rows.   C. 3 rounds of: squat side steps (each side); 10 diagonal elbows to knees; 10 superman.    (Adjust reps, weight, and sets as needed.)     - Document which strength training workout you do each week.  2. Set aside a few minutes once a week to plan dinner meals in the coming week, using your meal planning form, including making a list of groceries needed.  Alternate this weekly responsibility with your wife, and set a phone alert to remind you to do the meal planning.    3. Include a vegetable serving in >10 meals per week.      - Be generous in the serving size(s) of your veg's.    Bring your Goals Sheet to your follow-up appt.     Follow-up In-office appt on Tuesday, 10/4 at 11 AM.

## 2020-12-19 NOTE — Progress Notes (Signed)
Medical Nutrition Therapy PCP Bradley Marshall, PA; [facility] Patient has completed 3rd dose of COVID-19 vaccine in October 2021. Appt start time: 1100 end time: 1200 (1 hour)  Relevant history/background: Referred by Bradley Marshall, PA for MNT related to obesity (E66.9).  He also has a h/o GERD that he would like to address.  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  He has had difficulty managing his weight during the COVID pandemic, having gained ~20 lb in 2021.  Lives with his wife with whom he shares food preparation responsibilities.     Assessment:  Bradley Harrison has been remodeling their kitchen, which has increasing restaurant and takeout food.  Before kitchen project started, he had done a week of good planning.  He documented goals progress most days, and brought his Goals Sheet to today's appt.  Weight training has just not happened, although Bradley Harrison is walking at least 5 X wk.  Obstacles to strength training include space limitations (especially with kitchen renovation) and the fact that Bradley Harrison absolutely does not enjoy weight training.   Weight: 283.8 lb , stable past few months.   Recent eating pattern: 3 meals and 0-1 snack per day.   Usual physical activity: walking 40-50 min 5-6 X wk; has not yet restarted weight training.  Sleep: 6-8 hrs of sleep/night weekdays (~7 on weekends).   24-hr recall:  (Up at 8 AM) B (10 AM)-  1 small egg & chs biscuit, 2 c watermelon, water Snk ( AM)-  1 c coffee, 2-3 tbsp oat milk L (1 PM)-  1 1/2 c pad thai (chx, rice noodles), side Grk salad (olives, feta, no dressing), water Snk ( PM)-  --- D (7 PM)-  (B'day dinner): 1 pork chop, 4 spears asparagus, 1 c polenta, 1/2 key lime pie, 1/2 slc choc torte, water Snk ( PM)-  --- Typical day? No. Does not usually have dessert, and anticipates eating out less often once kitchen renovation is complete.    Nutritional Diagnosis:  No progress noted on NB-1.1 Food and nutrition-related knowledge  deficit as related to weight and reflux management as evidenced by not meeting exercise and vegetables goals most days.    Intervention: Completed diet and exercise history, modified behavioral goals and Goals Sheet.  Spent some time exploring patient's ambivalence about and obstacles to strength training as well as potential solutions.    For recommendations and goals, see Patient Instructions.    Follow-up: In-office appt in 6 weeks.  Thoren Hosang,[name]

## 2021-01-30 ENCOUNTER — Other Ambulatory Visit: Payer: Self-pay

## 2021-01-30 ENCOUNTER — Encounter: Payer: Self-pay | Admitting: Family Medicine

## 2021-01-30 ENCOUNTER — Ambulatory Visit (INDEPENDENT_AMBULATORY_CARE_PROVIDER_SITE_OTHER): Payer: BC Managed Care – PPO | Admitting: Family Medicine

## 2021-01-30 DIAGNOSIS — E669 Obesity, unspecified: Secondary | ICD-10-CM | POA: Diagnosis not present

## 2021-01-30 NOTE — Progress Notes (Signed)
Medical Nutrition Therapy PCP Horton Marshall, PA; [facility] Patient has completed 3rd dose of COVID-19 vaccine in October 2021. Appt start time: 1100 end time: 1200 (1 hour)  Relevant history/background: Referred by Horton Marshall, PA for MNT related to obesity (E66.9).  He also has a h/o GERD that he would like to address.  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  He has had difficulty managing his weight during the COVID pandemic, having gained ~20 lb in 2021.  Lives with his wife with whom he shares food preparation responsibilities.     Assessment:  Bradley Harrison has restarted weight training, usually once/week, but up to 3 X wk, which is his goal.  He has found that defining his exercise routine in advance has made follow-through easier.  His kitchen renovation is complete, so that helps with meeting food-related goals, although he said he's done little meal planning, which he knows helps him obtain more balanced meals, including vegetables.  He would like to document using his Goals Sheet, but finds it hard to remember to do so.  He has, however, documented weight workouts consistently.   Weight: 286.0 lb, stable past few months.   Recent eating pattern: 3 meals and 0-1 snack per day.   Usual physical activity: Walking 30-50 min 4 X wk; weight training ~20 min 1-3 X wk (aiming for MWF).    24-hr recall:  (Up at 10 AM; went to ophthalm appt) B (11 AM)-  16 oz mocha (with oat milk) Snk ( AM)-  --- L (11:30 AM)-  2 c beef chili, 1 pc cornbread, 1/2 tsp butter, 1/2 oz cheese, 1 tbsp sour cream, water Snk ( PM)-  1 c coffee, oat milk D (5 PM)-  2 c bean & broccoli soup, 1 pc cornbread, 1/2 tsp butter, 1 c grn beans, water Snk ( PM)-  1 1/2 c mixed fruit, 1 1/2 c mixed nuts with chocolate, water Typical day? No.  Usually has breakfast, but slept late.    Nutritional Diagnosis:  Slight progress noted on NB-1.1 Food and nutrition-related knowledge deficit as related to weight and  reflux management as evidenced by sometimes meeting exercise goals.    Intervention: Completed diet and exercise history, and dicussed ways in which Bradley Harrison can increase his success in meeting behavioral goals (most entailed planning and accountability).    For recommendations and goals, see Patient Instructions.    Follow-up: In-office appt in 8 weeks, with twice-monthly emailed progress reports.    Bradley Harrison,Bradley Harrison

## 2021-01-30 NOTE — Patient Instructions (Signed)
-   Complete the Meal Planning form - or use any other method to plan at least 5 dinner meals.  Email these meals to [facility] for feedback.    - Schedule in calendar (with alert) to place your Goals Sheet on top of your keyboard at the end of each workday, so it's easier to remember to document.    Goals: 1. Walk 30-45 min at least 5 days a week AND strength training that includes the following: 3 exercises 3 days a week, such as:              A. 3 rounds of: 12 squats; 10 bent-knee sit-ups; 15 bent-over flies.              B. 3 rounds of: 10 lunges (each side); 10-sec planks; 5 bent-over rows.              C. 3 rounds of: squat side steps (each side); 10 diagonal elbows to knees; 10 superman.               (Adjust reps, weight, and sets as needed.)     - Document which strength training workout you do each week.  2. Include a vegetable serving in >10 meals per week.      - Be generous in the serving size(s) of your veg's.   3.  Set aside a few minutes once a week to plan dinner meals for the coming week, including making a list of groceries needed.  This also entails writing out the meals planned for the week, which you can use to document veg intake just by checking off.  (Consider alternating this weekly responsibility with your wife.)  Remember to consider "grab-and-go" veg's (carrots, celery, cucumbers, small tomatoes) yas well as frozen veg's.    Email a progress report every 2 weeks.  Bring your Goals Sheet to your follow-up appt.     Follow-up In-office appt on Tues, Oct 29 at 11 AM.

## 2021-03-07 DIAGNOSIS — Z0000 Encounter for general adult medical examination without abnormal findings: Secondary | ICD-10-CM | POA: Diagnosis not present

## 2021-03-07 DIAGNOSIS — Z13220 Encounter for screening for lipoid disorders: Secondary | ICD-10-CM | POA: Diagnosis not present

## 2021-03-27 ENCOUNTER — Ambulatory Visit: Payer: BC Managed Care – PPO | Admitting: Family Medicine

## 2021-04-03 ENCOUNTER — Encounter: Payer: Self-pay | Admitting: Family Medicine

## 2021-04-03 ENCOUNTER — Other Ambulatory Visit: Payer: Self-pay

## 2021-04-03 ENCOUNTER — Ambulatory Visit (INDEPENDENT_AMBULATORY_CARE_PROVIDER_SITE_OTHER): Payer: BC Managed Care – PPO | Admitting: Family Medicine

## 2021-04-03 DIAGNOSIS — E669 Obesity, unspecified: Secondary | ICD-10-CM

## 2021-04-03 NOTE — Patient Instructions (Addendum)
-   Use your list of meal ideas for shopping, and keep this list readily available to use each week.    - Document on your Goals Sheet at the time of whatever activity you are doing.  Schedule an alarm to remind you to review your Goals Sheet at the end of each day.  Remember to give yourself a check mark for weekly planning.     Goals: 1. Walk 30-45 min at least 5 days a week AND strength training that includes the following: 3 exercises 3 days a week, such as:              A. 3 rounds of: 12 squats; 10 bent-knee sit-ups; 15 bent-over flies.              B. 3 rounds of: 10 lunges (each side); 10-sec planks; 5 bent-over rows.              C. 3 rounds of: squat side steps (each side); 10 diagonal elbows to knees; 10 superman.               (Adjust reps, weight, and sets as needed.)  Remember the 5-min rule of exercise:  Promise yourself you'll do at least 5 minutes.  If at the end of 5 minutes, you still don't want to exercise, you can stop.  Or on days when it feels harder to get motivated, consider how you can change the workout.       - Document which strength training workout you do.   2. Include a vegetable serving in >10 meals per week.      - Be generous in the serving size(s) of your veg's.    3.  Set aside a few minutes once a week to plan dinner meals for the coming week, including making a list of groceries needed.  (Consider alternating this weekly responsibility with your wife.)  Remember to consider "grab-and-go" veg's (carrots, celery, cucumbers, small tomatoes) as well as frozen veg's.     Email a progress report every 2 weeks.  Bring your Goals Sheet to your follow-up appt.     Follow-up In-office appt on Tuesday, Feb 7 at 3 PM.

## 2021-04-03 NOTE — Progress Notes (Signed)
Medical Nutrition Therapy PCP Horton Marshall, PA; [facility] Patient has completed 3rd dose of COVID-19 vaccine in October 2021. Appt start time: 1100 end time: 1200 (1 hour)  Relevant history/background: Referred by Horton Marshall, PA for MNT related to obesity (E66.9).  He also has a h/o GERD that he would like to address.  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  He has had difficulty managing his weight during the COVID pandemic, having gained ~20 lb in 2021.  Lives with his wife with whom he shares food preparation responsibilities.     Assessment:  Bradley Harrison is even more interested in healthy and efficient food prep b/c he and his wife are expecting a baby next spring.  He documented progress on goals at least half the time, which suggested he is usually meeting his veg goal.  He made a list of meal components to mix and match; has not yet started cooking leftovers to streamline food prep.   We discussed ways he can help himself follow through with documentation of his goals progress.   Weight: 283.0 lb (286.0 lb on 11/4) Ht 6'3".  Recent eating pattern: 3 meals and 0-1 snack per day.   Usual physical activity: Weight training ~20 min, 1-3 X wk and walking 30-50 min 4-5 X wk.    24-hr recall  (Up at 8 AM) B (9:30 AM)-  1 1/2 c rice&chx Snk (10 AM)-  12 oz mocha coffee L ( PM)-  ??? Snk ( PM)-  1 apple, water D (5:30 PM)-  2 piece eggplant parmigian, water Snk ( PM)-  1 fruit popsicle Typical day? Not sure; can't remember lunch.    Nutritional Diagnosis:  Slight progress noted on NB-1.1 Food and nutrition-related knowledge deficit as related to weight and reflux management as evidenced by more consistent documentation of progress on goals and continued interest in pursuing same behavioral goals.    Intervention: Completed diet and exercise history, and dicussed ways in which Damarcus can increase his success in documentation and accountability.    For recommendations and  goals, see Patient Instructions.    Follow-up: In-office appt in 8 weeks, with 1-2 emailed progress reports.    Shamila Lerch,[name]

## 2021-04-16 DIAGNOSIS — Z20822 Contact with and (suspected) exposure to covid-19: Secondary | ICD-10-CM | POA: Diagnosis not present

## 2021-05-29 DIAGNOSIS — F4322 Adjustment disorder with anxiety: Secondary | ICD-10-CM | POA: Diagnosis not present

## 2021-06-05 ENCOUNTER — Other Ambulatory Visit: Payer: Self-pay

## 2021-06-05 ENCOUNTER — Encounter: Payer: Self-pay | Admitting: Family Medicine

## 2021-06-05 ENCOUNTER — Ambulatory Visit (INDEPENDENT_AMBULATORY_CARE_PROVIDER_SITE_OTHER): Payer: BC Managed Care – PPO | Admitting: Family Medicine

## 2021-06-05 DIAGNOSIS — E669 Obesity, unspecified: Secondary | ICD-10-CM | POA: Diagnosis not present

## 2021-06-05 NOTE — Patient Instructions (Addendum)
Pay attn to both quality and quantity of sleep, and do what you can to optimize both.   (And listen to recommended podcast Sunday Read from [employer]: https://podcasts.apple.com/us/podcast/the-sunday-read-the-quest-by-circadian-medicine-to/id1200361736?E=3154008676195)  Consider seeing a sleep medicine physician.  Theressa Millard, MD is someone you may want to see for evaluation and suggestions.    Goals: 1. Walk 30-45 min at least 5 days a week AND do strength training that includes one of your 3 workouts 3 days a week.   2. Include a vegetable serving in >10 meals per week.      - Be generous in the serving size(s) of your veg's.   - Be sure to keep foods on hand that allow you to make relatively easy balanced meals at any given time.     Follow-up In-office appt on Monday, March 20 at 11 AM.

## 2021-06-05 NOTE — Progress Notes (Signed)
Medical Nutrition Therapy PCP Horton Marshall, PA; [facility] Patient has completed 3rd dose of COVID-19 vaccine in October 2021. Appt start time: 1500 end time: 1600 (1 hour)  Relevant history/background: Referred by Horton Marshall, PA for MNT related to obesity (E66.9).  He also has a h/o GERD that he would like to address.  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  He has had difficulty managing his weight during the COVID pandemic, having gained ~20 lb in 2021.  Lives with his wife with whom he shares food preparation responsibilities.     Assessment:  Bradley Harrison has been better at documenting his progress on goals.  He has been eating vegetables at least 8 times per week with a range of to 6-11 (excluding holidays), and his vegetable portions have been large.  He has been pretty consistent in food planning and grocery shopping while avoiding rigid planning, which neither he nor his wife want.   Bradley Harrison's baby's due date is April 29.  Pregnancy going well so far.   Weight: 287.2 lb (283.0 lb on 12/6). Ht 6'3".  Recent eating pattern: 3 meals and 0-1 snack per day.   Usual physical activity: Weight training ~20 min, 1-2 X wk and walking 30-50 min 3-4 X wk.   Sleep: Estimates he gets 5-8 hrs/night.  Sometimes has difficulty falling asleep (at least 3 X wk).   24-hr recall:   (Up at 8 AM; drank water) B (10 AM)-  1-egg sandwich, 2 tsp mayo, tomato slice, handful lettuce, 1/3 c feta, 1 c coffee, 1 tbsp honey, 1 c oat milk Snk ( AM)-  water L (1 PM)-  1 soy chx sandwich w/ 2 tbsp goat chs, large salad, 1 tbsp drsng, water Snk (3 PM)-  1 apple, 1 c coffee D (6 PM)-  Vegan rest: Soyburger w/ soy chs, tom, drsng, 1 1/2 c tator tots, ketchup, water Snk ( PM)-  3 tbsp feta cheese Typical day? Yes.  Eats restaurant/takeout food ~4 X wk.    Nutritional Diagnosis:  Some progress on NB-1.1 Food and nutrition-related knowledge deficit as related to weight and reflux management as evidenced by  more consistent documenting on goals.    Intervention: Completed diet and exercise history, and discussed importance of sleep to weight management efforts.    For recommendations and goals, see Patient Instructions.    Follow-up: In-office appt in 6 weeks.    Bradley Harrison,[name]

## 2021-07-16 ENCOUNTER — Ambulatory Visit: Payer: BC Managed Care – PPO | Admitting: Family Medicine

## 2021-07-30 NOTE — Progress Notes (Signed)
Medical Nutrition Therapy ?PCP Horton Marshall, PA; [facility] ?Patient has completed 3rd dose of COVID-19 vaccine in October 2021. ?Appt start time: 0930 end time: 1030 (1 hour) ? ?Relevant history/background: Referred by Horton Marshall, PA for MNT related to obesity (E66.9).  He also has a h/o GERD that he would like to address.  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  He has had difficulty managing his weight during the COVID pandemic, having gained ~20 lb in 2021.  Lives with his wife with whom he shares food preparation responsibilities.    ? ?Assessment:  Bradley Harrison has had a couple weeks of no exercise b/c of illness, but had been doing strength training 3 X wk and walking 3 X wk before getting sick.  Was also eating vegetables 10-12 times per week before getting sick.  Time constraints continue to be a barrier to exercise.  [name]'s baby's due date is April 29.   ?Weight: 287 lb (self-rept) (287.2 lb on 2/7). Ht 6'3".  ?Recent eating pattern: 3 meals and 0-1 snack per day.   ?Usual physical activity: Weight training ~20 min 3 X wk and walking 30-50 min 3 X wk before getting sick on 3/25.   ?Sleep: Same or slightly better, 5-8 hrs/night. Got a black-out curtain for bedroom and a sunrise alarm clock.  (Listened to podcast re. circadian rhythms and sleep.)   ? ?24-hr recall:  ?(Up at 8 AM; hadn't fallen asleep till ~3 AM) ?B (8:30 AM)-  6 oz coffee, 6 oz oat milk, 1+ tbsp honey, water ?Snk (9 AM)-  Oatmeal (? not sure ?), 6 oz coffee, 6 oz oat milk, 1+ tbsp honey ?L (1 PM)-  1 pork enchilada, fried tortilla with beans, water ?Snk (4 PM)-  1 handful peanuts, 1 banana, water ?D (? PM)-  ? can't remember ? ?Snk ( PM)-  --- ?Typical day? Yes.   But can't remember whole day.   ?  ?Nutritional Diagnosis:  Continued progress on NB-1.1 Food and nutrition-related knowledge deficit as related to weight and reflux management as evidenced by more consistently obtaining vegetables at meals.   ? ?Intervention:  Completed diet and exercise history, and discussed importance of sleep to weight management efforts.   ? ?For recommendations and goals, see Patient Instructions.   ? ?Follow-up: In-office appt in 10 weeks.   ? ?[name],[name] ?  ?

## 2021-07-31 ENCOUNTER — Ambulatory Visit (INDEPENDENT_AMBULATORY_CARE_PROVIDER_SITE_OTHER): Payer: BC Managed Care – PPO | Admitting: Family Medicine

## 2021-07-31 DIAGNOSIS — E669 Obesity, unspecified: Secondary | ICD-10-CM | POA: Diagnosis not present

## 2021-07-31 NOTE — Patient Instructions (Addendum)
Goals: ?1. Walk 30-45 min at least 5 days a week AND do strength training at least 3 times a week.  ?  ?2. Include a vegetable serving in >10 meals per week.   ?   - Be generous in the serving size(s) of your veg's. ?   - Remember that you can always add a vegetable to takeout at home.    ? ?- Continue to document progress on your goals.   ? ?- Be sure to keep foods on hand that allow you to make relatively easy balanced meals at any given time, including fresh fruit and frozen vegetables.   ? ?Night eating: Do your best to resist this.  Eating during the night is not desirable for several reasons: (1) Your insulin is less sensitive late in the day, so blood sugar is going to be better controlled with most calories (especially carbohydrate) in the earlier part of the day; (2) There are many "bio-clocks" in your liver, so when you eat has a potential impact on modulating circadian rhythms; (3) It is all too easy for this practice to turn into a habit.   ?   - Alternatives to eating when you can't sleep at night: Get out of bed, and read or write.  (Avoid screens.)  You may also want to experiment with some deep breathing exercises or guided meditations.   ? ?Follow-up In-office appt on Tuesday, June 13 at 11 AM.  ?  ?

## 2021-09-26 DIAGNOSIS — T24201A Burn of second degree of unspecified site of right lower limb, except ankle and foot, initial encounter: Secondary | ICD-10-CM | POA: Diagnosis not present

## 2021-09-26 DIAGNOSIS — L089 Local infection of the skin and subcutaneous tissue, unspecified: Secondary | ICD-10-CM | POA: Diagnosis not present

## 2021-10-01 ENCOUNTER — Ambulatory Visit: Payer: BC Managed Care – PPO | Admitting: Podiatry

## 2021-10-01 ENCOUNTER — Encounter: Payer: Self-pay | Admitting: Podiatry

## 2021-10-01 DIAGNOSIS — M2142 Flat foot [pes planus] (acquired), left foot: Secondary | ICD-10-CM

## 2021-10-01 DIAGNOSIS — M21861 Other specified acquired deformities of right lower leg: Secondary | ICD-10-CM | POA: Diagnosis not present

## 2021-10-01 DIAGNOSIS — M2141 Flat foot [pes planus] (acquired), right foot: Secondary | ICD-10-CM | POA: Diagnosis not present

## 2021-10-01 DIAGNOSIS — M7741 Metatarsalgia, right foot: Secondary | ICD-10-CM

## 2021-10-01 DIAGNOSIS — S86899A Other injury of other muscle(s) and tendon(s) at lower leg level, unspecified leg, initial encounter: Secondary | ICD-10-CM

## 2021-10-01 DIAGNOSIS — S91132A Puncture wound without foreign body of left great toe without damage to nail, initial encounter: Secondary | ICD-10-CM | POA: Diagnosis not present

## 2021-10-01 DIAGNOSIS — M21862 Other specified acquired deformities of left lower leg: Secondary | ICD-10-CM | POA: Diagnosis not present

## 2021-10-01 DIAGNOSIS — M7742 Metatarsalgia, left foot: Secondary | ICD-10-CM | POA: Diagnosis not present

## 2021-10-01 DIAGNOSIS — W450XXA Nail entering through skin, initial encounter: Secondary | ICD-10-CM | POA: Diagnosis not present

## 2021-10-01 NOTE — Progress Notes (Signed)
  Subjective:  Patient ID: Bradley Harrison, male    DOB: April 28, 1986,  MRN: [id number]  Chief Complaint  Patient presents with   Foot Orthotics     NP  foot and leg pain  - needs custom orthotics    36 y.o. male presents with the above complaint. History confirmed with patient.  Most of the pain is along the inside of the lower leg and shin he has been more active recently had a baby recently.  Objective:  Physical Exam: warm, good capillary refill, no trophic changes or ulcerative lesions, normal DP and PT pulses, normal sensory exam, and no significant foot deformity has good smooth range of motion of subtalar and midfoot joints, he does have significant equinus that improves with knee flexion bilateral, prominent metatarsal heads bilateral as well. Assessment:   1. Medial tibial stress syndrome, unspecified laterality, initial encounter   2. Pes planus of both feet   3. Gastrocnemius equinus of left lower extremity   4. Gastrocnemius equinus of right lower extremity   5. Metatarsalgia of both feet      Plan:  Patient was evaluated and treated and all questions answered.  I reviewed with him that I think the primary contributing component here is the equinus deformity.  I recommended home physical therapy and stretching exercises were given to him.  We discussed the option of formal physical therapy to include dry needling and manual therapy and massage if this does not improve within 1 month.  He will let me know how he is doing.  We also discussed orthotic support and he recently purchased over-the-counter insoles and is still breaking these in.  We discussed a custom molded insole which may help but still needs to have the equinus addressed.  He will check with his insurance to see if this is a covered service for him and will call for scheduling for fitting for orthotics.  I will see him back in about 2 months to reevaluate.  Return in about 8 weeks (around 11/26/2021) for recheck calf  pain.

## 2021-10-01 NOTE — Patient Instructions (Signed)
RANGE OF MOTION (ROM) AND STRETCHING EXERCISES   These exercises may help you when beginning to rehabilitate your injury. Your symptoms may resolve with or without further involvement from your physician, physical therapist or athletic trainer. While completing these exercises, remember:  Restoring tissue flexibility helps normal motion to return to the joints. This allows healthier, less painful movement and activity. An effective stretch should be held for at least 30 seconds. A stretch should never be painful. You should only feel a gentle lengthening or release in the stretched tissue.  STRETCH  Gastroc, Standing  Place hands on wall. Extend right / left leg, keeping the front knee somewhat bent. Slightly point your toes inward on your back foot. Keeping your right / left heel on the floor and your knee straight, shift your weight toward the wall, not allowing your back to arch. You should feel a gentle stretch in the right / left calf. Hold this position for 10 seconds. Repeat 3 times. Complete this stretch 2 times per day.  STRETCH  Soleus, Standing  Place hands on wall. Extend right / left leg, keeping the other knee somewhat bent. Slightly point your toes inward on your back foot. Keep your right / left heel on the floor, bend your back knee, and slightly shift your weight over the back leg so that you feel a gentle stretch deep in your back calf. Hold this position for 10 seconds. Repeat 3 times. Complete this stretch 2 times per day.  STRETCH  Gastrocsoleus, Standing  Note: This exercise can place a lot of stress on your foot and ankle. Please complete this exercise only if specifically instructed by your caregiver.  Place the ball of your right / left foot on a step, keeping your other foot firmly on the same step. Hold on to the wall or a rail for balance. Slowly lift your other foot, allowing your body weight to press your heel down over the edge of the step. You should  feel a stretch in your right / left calf. Hold this position for 10 seconds. Repeat this exercise with a slight bend in your knee. Repeat 3 times. Complete this stretch 2 times per day.   STRENGTHENING EXERCISES . These exercises may help you when beginning to rehabilitate your injury. They may resolve your symptoms with or without further involvement from your physician, physical therapist or athletic trainer. While completing these exercises, remember:  Muscles can gain both the endurance and the strength needed for everyday activities through controlled exercises. Complete these exercises as instructed by your physician, physical therapist or athletic trainer. Progress the resistance and repetitions only as guided. You may experience muscle soreness or fatigue, but the pain or discomfort you are trying to eliminate should never worsen during these exercises. If this pain does worsen, stop and make certain you are following the directions exactly. If the pain is still present after adjustments, discontinue the exercise until you can discuss the trouble with your clinician.  STRENGTH - Plantar-flexors  Sit with your right / left leg extended. Holding onto both ends of a rubber exercise band/tubing, loop it around the ball of your foot. Keep a slight tension in the band. Slowly push your toes away from you, pointing them downward. Hold this position for 10 seconds. Return slowly, controlling the tension in the band/tubing. Repeat 3 times. Complete this exercise 2 times per day.   STRENGTH - Plantar-flexors  Stand with your feet shoulder width apart. Steady yourself with a  wall or table using as little support as needed. Keeping your weight evenly spread over the width of your feet, rise up on your toes.* Hold this position for 10 seconds. Repeat 3 times. Complete this exercise 2 times per day.  *If this is too easy, shift your weight toward your right / left leg until you feel challenged.  Ultimately, you may be asked to do this exercise with your right / left foot only.  STRENGTH  Plantar-flexors, Eccentric  Note: This exercise can place a lot of stress on your foot and ankle. Please complete this exercise only if specifically instructed by your caregiver.  Place the balls of your feet on a step. With your hands, use only enough support from a wall or rail to keep your balance. Keep your knees straight and rise up on your toes. Slowly shift your weight entirely to your right / left toes and pick up your opposite foot. Gently and with controlled movement, lower your weight through your right / left foot so that your heel drops below the level of the step. You will feel a slight stretch in the back of your calf at the end position. Use the healthy leg to help rise up onto the balls of both feet, then lower weight only on the right / left leg again. Build up to 15 repetitions. Then progress to 3 consecutive sets of 15 repetitions.* After completing the above exercise, complete the same exercise with a slight knee bend (about 30 degrees). Again, build up to 15 repetitions. Then progress to 3 consecutive sets of 15 repetitions.* Perform this exercise 2 times per day.  *When you easily complete 3 sets of 15, your physician, physical therapist or athletic trainer may advise you to add resistance by wearing a backpack filled with additional weight.  STRENGTH - Plantar Flexors, Seated  Sit on a chair that allows your feet to rest flat on the ground. If necessary, sit at the edge of the chair. Keeping your toes firmly on the ground, lift your right / left heel as far as you can without increasing any discomfort in your ankle. Repeat 3 times. Complete this exercise 2 times a day.

## 2021-10-05 ENCOUNTER — Ambulatory Visit (INDEPENDENT_AMBULATORY_CARE_PROVIDER_SITE_OTHER): Payer: BC Managed Care – PPO

## 2021-10-05 DIAGNOSIS — M7741 Metatarsalgia, right foot: Secondary | ICD-10-CM

## 2021-10-05 DIAGNOSIS — Q666 Other congenital valgus deformities of feet: Secondary | ICD-10-CM | POA: Diagnosis not present

## 2021-10-05 DIAGNOSIS — M2142 Flat foot [pes planus] (acquired), left foot: Secondary | ICD-10-CM | POA: Diagnosis not present

## 2021-10-05 DIAGNOSIS — M2141 Flat foot [pes planus] (acquired), right foot: Secondary | ICD-10-CM | POA: Diagnosis not present

## 2021-10-05 DIAGNOSIS — M7742 Metatarsalgia, left foot: Secondary | ICD-10-CM | POA: Diagnosis not present

## 2021-10-05 NOTE — Progress Notes (Signed)
SITUATION Reason for Consult: Evaluation for Bilateral Custom Foot Orthoses Patient / Caregiver Report: Patient is ready for foot orthotics  OBJECTIVE DATA: Patient History / Diagnosis:    ICD-10-CM   1. Pes planus of both feet  M21.41    M21.42     2. Metatarsalgia of both feet  M77.41    M77.42       Current or Previous Devices:   None and no history  Foot Examination: Skin presentation:   Intact Ulcers & Callousing:   None Toe / Foot Deformities:  Pes planus Weight Bearing Presentation:  Planus Sensation:    Intact  Shoe Size:    76M  ORTHOTIC RECOMMENDATION Recommended Device: 1x pair of custom functional foot orthotics  GOALS OF ORTHOSES - Reduce Pain - Prevent Foot Deformity - Prevent Progression of Further Foot Deformity - Relieve Pressure - Improve the Overall Biomechanical Function of the Foot and Lower Extremity.  ACTIONS PERFORMED Potential out of pocket cost was communicated to patient. Patient understood and consent to casting. Patient was casted for Foot Orthoses via crush box. Procedure was explained and patient tolerated procedure well. Casts were shipped to central fabrication. All questions were answered and concerns addressed.  PLAN Patient is to be called for fitting when devices are ready.

## 2021-10-09 ENCOUNTER — Ambulatory Visit (INDEPENDENT_AMBULATORY_CARE_PROVIDER_SITE_OTHER): Payer: BC Managed Care – PPO | Admitting: Family Medicine

## 2021-10-09 DIAGNOSIS — E669 Obesity, unspecified: Secondary | ICD-10-CM | POA: Diagnosis not present

## 2021-10-09 NOTE — Patient Instructions (Signed)
Getting back to a consistent schedule of eating DURING THE DAYTIME as well as a consistent sleeping schedule will be important not just for your health, but for setting some positive routines in the household.   (Consider using earplugs when the other parent is "on call.")  Goals: 1. Do prescribed PT exercises at least 5 days a week.  2. Eat 3 meals and 1-2 snacks per day.  Aim for consistent eating times.    Make a list of foods you want to have on hand at all times, e.g., ready-to-eat fruits and veg's, as well as easy-to-make meals like sandwiches.  For example: yogurt, cereal, instant oatmeal, mixed greens and salad vegetables, bread, peanut butter, canned tuna, frozen veg's, canned beans.   Consider what foods you DO buy as well as those you DON'T buy.  Remember, if you want something sweet, you can always go out for ice cream.   (Quick meal options:)     - Microwave in a bowl: canned beans, frozen vegetables, and spaghetti sauce.)    - Canned soup added to microwaved frozen vegetables.   Make a list of quick, easy meals you and your wife like.    When your routines around the house settle down somewhat, you'll want to start focusing on getting balanced meals (protein, starch, and vegetables for both lunch and dinner.)  Suggestion:   Dr. Assunta Gambles website:  https://recipes.doctoryum.org/en/makers/curry-[email or web address]-hurry [employer]: recipes, blog, advice:  PCFeed.ca  Follow-up In-office appt on Tuesday, July 11 at 10 AM.

## 2021-10-09 NOTE — Progress Notes (Signed)
Medical Nutrition Therapy PCP Horton Marshall, PA; [facility] Patient has completed 3rd dose of COVID-19 vaccine in October 2021. Appt start time: 1100 end time: 1200 (1 hour)  Relevant history/background: Referred by Horton Marshall, PA for MNT related to obesity (E66.9).  He also has a h/o GERD that he would like to address.  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  He has had difficulty managing his weight during the COVID pandemic, having gained ~20 lb in 2021.  Lives with his wife with whom he shares food preparation responsibilities.     Assessment:  Bradley Harrison's son Bradley Harrison was born on April 23, which has obviously affected daily life, and made meeting his diet and exercise goals more challenging.  He has not been getting much sleep.  He was diagnosed with bilateral medial tibial stress syndrome in June, explaining leg and foot pain he was having.  He is getting orthotics, and has been given exercises at home, which he has not consistently done.   Weight: 291.4 lb. Ht 6'3".  Recent eating pattern: Erratic; varies day to day.   Usual physical activity: None currently.  Tries to get out with the baby in the stroller.   Sleep: 5-6 hrs/night. Baby is in the bedroom with them.  Has occasionally been night eating, but is trying to get back to normal eating and sleep schedule.    No food recall today.  Bradley Harrison could not remember yesterday's intake.     Nutritional Diagnosis:  Regression on NB-1.1 Food and nutrition-related knowledge deficit as related to weight and reflux management as evidenced by erratic eating schedule and heavy reliance on pre-prepared foods.    Intervention: Completed diet and exercise history, and discussed priorities for getting back on track.    For recommendations and goals, see Patient Instructions.    Follow-up: In-office appt in 4 weeks.    Bradley Harrison,[name]

## 2021-11-06 ENCOUNTER — Ambulatory Visit (INDEPENDENT_AMBULATORY_CARE_PROVIDER_SITE_OTHER): Payer: BC Managed Care – PPO | Admitting: Family Medicine

## 2021-11-06 DIAGNOSIS — E669 Obesity, unspecified: Secondary | ICD-10-CM | POA: Diagnosis not present

## 2021-11-06 NOTE — Progress Notes (Signed)
Telehealth encounter for Medical Nutrition Therapy (MNT)  PCP Horton Marshall, PA; [facility] Patient has completed 3rd dose of COVID-19 vaccine in October 2021.  I connected with [name] (MRN [id number]) on 11/06/2021 by [facility] video-enabled, HIPAA-compliant telemedicine application, verified that I was speaking with the correct person using two identifiers, and that the patient was in a private environment conducive to confidentiality.  The patient agreed to proceed.  Persons participating in visit were patient and provider (registered dietitian) Linna Darner, PhD, RD, LDN, CEDS.  Provider was located at [facility] during this telehealth encounter; patient was at home.  Appt start time: 1000 end time: 1100 (1 hour)  Reason for visit: Referred by Horton Marshall, PA for MNT related to obesity (E66.9).  He also has a h/o GERD that he would like to address.    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  He gained ~20 lb in 2021 during the COVID pandemic, and has struggled to lose weight.  Lives with his wife with whom he shares food preparation responsibilities.  Their first child [name]) was born on 08/19/21, which has added time constraints and some other stresses to their lives.    Assessment: [name] has been able to get more sleep as the baby has also started to sleep more and with more consistency.  He starts back to work on Monday.  His shin splints have improved with rest (has not been doing home exercises).  It has still been difficult to find time (and energy) to exercise.  They have been buying takeout or prepared meals several times per week.    Weight: No wt check today (remote visit).  (291.4 lb on 10/09/21.)  Ht 6'3".  Usual eating pattern: 3 meals and 1-2 snacks per day. Usual physical activity: Tries to get out with baby in the stroller at least 3 X wk. Sleep: ~7 hrs/night.  24-hr recall:  (Up at 8 AM) B (9  AM)-  1 1/2 c plain Grk yogurt, 1/2 c granola, 1 banana, 1/2 c raspberries, 1 c coffee, 2 tbsp cream Snk ( AM)-  --- L (2 PM)-  1 parmesan-crusted chx brst, chs sauce, 3/4 c grn beans, 1+ c mashed potatoes, 1 1/2 Ital bread, 2 tsp butter, water Snk ( PM)-  --- D (6 PM)-  [place] food: 2 c chx&rice, water Snk ( PM)-  1/2 non-alc beer Typical day? No. Not usually eating out (lunch) as well as having restaurant food for dinner.    Nutritional Diagnosis:  Some progress noted on NB-1.1 Food and nutrition-related knowledge deficit as related to weight and reflux management as evidenced by increased consistency in eating pattern.   Intervention: Completed diet and exercise history, and modified behavioral goals.   For recommendations and goals, see Patient Instructions.    Follow-up: In-office appt 10 weeks  Draken Farrior,[name]

## 2021-11-06 NOTE — Patient Instructions (Addendum)
Vegetables:  An ideal way of eating is to include vegetables at both lunch and dinner.  The more consistently you make this happen, the more it becomes routine for you, or a norm in your household.   Add a vegetable serving to take-out or to [employer]; an easy way to do this is to microwave frozen veg's.   Consider roasting a lot of vegetables for leftovers.  And remember that frozen veg's can be roasted too.  Or make a big salad to which you can add dressing and some additional (more perishable) vegetables at time of eating.  Carrots keep well in the refrigerator, and are a quick vegetable alternative to go with lunch (as are other raw veg's like cucumber, bell pepper strips, or cherry tomatoes).    Goals: 1. Prepare a lot of vegetables (for leftovers) at least once weekly.   2. Eat 3 meals and 1-2 snacks per day at consistent times.   3. Do at least 5 minutes of some kind of physical activity 6 days per week.    Document # of minutes of exercise on a calendar.     Follow-up In-office appt on Tuesday, September 26 at 11 AM.

## 2021-11-09 DIAGNOSIS — S92912A Unspecified fracture of left toe(s), initial encounter for closed fracture: Secondary | ICD-10-CM | POA: Diagnosis not present

## 2021-11-26 ENCOUNTER — Encounter: Payer: Self-pay | Admitting: Podiatry

## 2021-11-26 ENCOUNTER — Ambulatory Visit: Payer: BC Managed Care – PPO | Admitting: Podiatry

## 2021-11-26 DIAGNOSIS — M2141 Flat foot [pes planus] (acquired), right foot: Secondary | ICD-10-CM | POA: Diagnosis not present

## 2021-11-26 DIAGNOSIS — M7741 Metatarsalgia, right foot: Secondary | ICD-10-CM | POA: Diagnosis not present

## 2021-11-26 DIAGNOSIS — M21861 Other specified acquired deformities of right lower leg: Secondary | ICD-10-CM | POA: Diagnosis not present

## 2021-11-26 DIAGNOSIS — M7742 Metatarsalgia, left foot: Secondary | ICD-10-CM

## 2021-11-26 DIAGNOSIS — M21862 Other specified acquired deformities of left lower leg: Secondary | ICD-10-CM | POA: Diagnosis not present

## 2021-11-26 DIAGNOSIS — M2142 Flat foot [pes planus] (acquired), left foot: Secondary | ICD-10-CM

## 2021-11-26 NOTE — Progress Notes (Signed)
Patient presents today to pick up custom molded foot orthotics, diagnosed with pes planus by Dr. Lilian Kapur.   Orthotics were dispensed and fit was satisfactory. Reviewed instructions for break-in and wear. Written instructions given to patient.  Patient will follow up as needed.   [facility] - order # [id number]

## 2021-11-26 NOTE — Progress Notes (Signed)
  Subjective:  Patient ID: Bradley Harrison, male    DOB: 1986/01/09,  MRN: [id number]  Chief Complaint  Patient presents with   Follow-up    8 wk follow up- patient is doing better and is picking up orthotics.  Patient injured 4th toe to left foot- hit in with table. [facility].    Foot Orthotics    Pick-up    36 y.o. male presents with the above complaint.  Has had some improvement doing his own therapy.  Objective:  Physical Exam: warm, good capillary refill, no trophic changes or ulcerative lesions, normal DP and PT pulses, normal sensory exam, and no significant foot deformity has good smooth range of motion of subtalar and midfoot joints, he does have significant equinus that improves with knee flexion bilateral, prominent metatarsal heads bilateral as well.  Overall equinus is improved Assessment:   1. Pes planus of both feet   2. Metatarsalgia of both feet   3. Gastrocnemius equinus of left lower extremity   4. Gastrocnemius equinus of right lower extremity      Plan:  Patient was evaluated and treated and all questions answered.  Has had some improvement.  He will continue his home therapy.  We also discussed that does not improve or worsens that surgical release of the contracture could offer further relief.  His orthotics were dispensed today as noted below.  He will return as needed  No follow-ups on file.

## 2021-12-07 ENCOUNTER — Encounter: Payer: Self-pay | Admitting: Podiatry

## 2021-12-12 ENCOUNTER — Ambulatory Visit (INDEPENDENT_AMBULATORY_CARE_PROVIDER_SITE_OTHER): Payer: BC Managed Care – PPO

## 2021-12-12 DIAGNOSIS — M2142 Flat foot [pes planus] (acquired), left foot: Secondary | ICD-10-CM

## 2021-12-12 DIAGNOSIS — M2141 Flat foot [pes planus] (acquired), right foot: Secondary | ICD-10-CM

## 2021-12-12 NOTE — Progress Notes (Signed)
Patient presents today to be casted for custom molded orthotics. Dr. Pes planus has been treating patient for [place].   Impression foam cast was taken. ABN signed.  Patient info-  Shoe size: 14 medium men's  Shoe style: Athletic shoes  Height: 6'3"  Weight: 280lbs  Patient recasted for orthotics due to currently ones not fitting properly. Old orthotics collected to be sent back to [facility] with new foam impression.    Patient will be notified once orthotics arrive in office and reappoint for fitting at that time.

## 2022-01-04 ENCOUNTER — Ambulatory Visit (INDEPENDENT_AMBULATORY_CARE_PROVIDER_SITE_OTHER): Payer: BC Managed Care – PPO | Admitting: *Deleted

## 2022-01-04 DIAGNOSIS — M2142 Flat foot [pes planus] (acquired), left foot: Secondary | ICD-10-CM

## 2022-01-04 DIAGNOSIS — M2141 Flat foot [pes planus] (acquired), right foot: Secondary | ICD-10-CM

## 2022-01-04 NOTE — Progress Notes (Signed)
Patient presents today to pick up custom molded foot orthotics, diagnosed with pes planus by Dr. Lilian Kapur.   Orthotics were dispensed and fit was satisfactory. Reviewed instructions for break-in and wear. Written instructions given to patient.  Patient will follow up as needed.

## 2022-01-21 NOTE — Progress Notes (Unsigned)
Telehealth encounter for Medical Nutrition Therapy (MNT)  PCP Marilynne Drivers, PA; [facility] Patient has completed 3rd dose of COVID-19 vaccine in October 2021.  I connected with Bradley Harrison (MRN [id number]) on 01/21/2022 by [employer] video-enabled, HIPAA-compliant telemedicine application, verified that I was speaking with the correct person using two identifiers, and that the patient was in a private environment conducive to confidentiality.  The patient agreed to proceed.  Persons participating in visit were patient and provider (registered dietitian) Kennith Center, PhD, RD, LDN, CEDS.  Provider was located at [facility] during this telehealth encounter; patient was at home.  Appt start time: 1000 end time: 1100 (1 hour)  Reason for visit: Referred by Marilynne Drivers, PA for MNT related to obesity (E66.9).  He also has a h/o GERD that he would like to address.    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  He gained ~20 lb in 2021 during the [place] pandemic, and has struggled to lose weight.  Lives with his wife with whom he shares food preparation responsibilities.  Their first child Bradley Harrison) was born on 08/19/21, which has added time constraints and some other stresses to their lives.    Assessment:  Bradley Harrison is back to work (after taking a few weeks paternity leave).    Shin splints have improved with rest (has not been doing home exercises).  It has still been difficult to find time (and energy) to exercise.    Weight: No wt check today (remote visit).  (291.4 lb on 10/09/21.)  Ht 6'3".  Usual eating pattern: 3 meals and 1-2 snacks per day. Usual physical activity: Tries to get out with baby in the stroller at least 3 X wk. Sleep: ~7 hrs/night.  24-hr recall:  (Up at 8 AM) B (9 AM)-  1 1/2 c plain Grk yogurt, 1/2 c granola, 1 banana, 1/2 c raspberries, 1 c coffee, 2 tbsp cream Snk ( AM)-  --- L (2 PM)-  1 parmesan-crusted  chx brst, chs sauce, 3/4 c grn beans, 1+ c mashed potatoes, 1 1/2 Ital bread, 2 tsp butter, water Snk ( PM)-  --- D (6 PM)-  [place] food: 2 c chx&rice, water Snk ( PM)-  1/2 non-alc beer Typical day? No. Not usually eating out (lunch) as well as having restaurant food for dinner.    Nutritional Diagnosis:  Some progress noted on NB-1.1 Food and nutrition-related knowledge deficit as related to weight and reflux management as evidenced by increased consistency in eating pattern.   Intervention: Completed diet and exercise history, and modified behavioral goals.   For recommendations and goals, see Patient Instructions.    Follow-up: In-office appt ***10 weeks  Bradley Harrison,Bradley Harrison  From 7/11 *** Vegetables:  An ideal way of eating is to include vegetables at both lunch and dinner.  The more consistently you make this happen, the more it becomes routine for you, or a norm in your household.   Add a vegetable serving to take-out or to [employer]; an easy way to do this is to microwave frozen veg's.   Consider roasting a lot of vegetables for leftovers.  And remember that frozen veg's can be roasted too.  Or make a big salad to which you can add dressing and some additional (more perishable) vegetables at time of eating.  Carrots keep well in the refrigerator, and are a quick vegetable alternative to go with lunch (as are other raw veg's like cucumber, bell  pepper strips, or cherry tomatoes).    Goals: 1. Prepare a lot of vegetables (for leftovers) at least once weekly.   2. Eat 3 meals and 1-2 snacks per day at consistent times.   3. Do at least 5 minutes of some kind of physical activity 6 days per week.    Document # of minutes of exercise on a calendar.    Follow-up In-office appt on ***.

## 2022-01-22 ENCOUNTER — Ambulatory Visit (INDEPENDENT_AMBULATORY_CARE_PROVIDER_SITE_OTHER): Payer: BC Managed Care – PPO | Admitting: Family Medicine

## 2022-01-22 DIAGNOSIS — E669 Obesity, unspecified: Secondary | ICD-10-CM | POA: Diagnosis not present

## 2022-01-22 NOTE — Patient Instructions (Addendum)
Child nutrition book recommendation: Child of Mine by [employer].    Make-ahead meals:  - Soups, chilis, stews, casseroles: Add as many veg's as you can.  In addition, add a side vegetable, if appropriate.   - Salads (add some vegetables like tomatoes when serving). - Roasted vegetables.  Can be eaten hot or cold.  And you can use frozen veg's to roast.    Goals: 1. Prepare a lot of vegetables (for leftovers) at least once weekly.   2. Do at least 10 minutes of some kind of physical activity 3 days per week.       - Some of these times may be a 10-15-minute walk with Pilar Plate when he needs attention.       - Or it could be a short workout with DBs or body weight (situps, squats, etc).       - Set a phone timer for 2-3-minute breaks for exercise throughout the day.     Document # of minutes of exercise on your desk calendar.    Follow-up In-office appt on Tuesday, November 7 at 3:30 PM.

## 2022-03-05 ENCOUNTER — Ambulatory Visit: Payer: BC Managed Care – PPO | Admitting: Family Medicine

## 2022-03-05 NOTE — Progress Notes (Deleted)
Medical Nutrition Therapy (MNT)  PCP Marilynne Drivers, PA; [facility]  Appt start time: 1287 end time: 1630 (1 hour)  Reason for visit: Referred by Marilynne Drivers, PA for MNT related to obesity (E66.9).  He also has a h/o GERD that he would like to address.    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  He gained ~20 lb in 2021 during the [place] pandemic, and has struggled to lose weight.  Lives with his wife with whom he shares food preparation responsibilities.  Their first child Pilar Plate) was born on 08/19/21, which has added time constraints and some other stresses to their lives.    Assessment:  [place] ***  Weight: *** lb.  (296.2 lb on 01/22/22.)  Ht 6'3".  Usual eating pattern: 3 meals and 1-2 snacks per day. Usual physical activity: Not exercising much.  Tries to get out with baby in the stroller ~2 X wk, ad walks the dog at least once a week.  Sleep: 4-7 hrs/night.  24-hr recall suggests intake of *** kcal:  (Up at  AM) B ( AM)-   Snk ( AM)-   L ( PM)-   Snk ( PM)-   D ( PM)-   Snk ( PM)-   Typical day? {yes [id number]  Nutritional Diagnosis:  Stable progress noted on NB-1.1 Food and nutrition-related knowledge deficit as related to weight and reflux management as evidenced by ***continued consistency in eating pattern.    Intervention: Completed diet and exercise history, and ***modified behavioral goals.   For recommendations and goals, see Patient Instructions.    Follow-up: In-office appt ***6 weeks.  Panagiota Perfetti,[name]  From 9/26: *** Make-ahead meals:  - Soups, chilis, stews, casseroles: Add as many veg's as you can.  In addition, add a side vegetable, if appropriate.   - Salads (add some vegetables like tomatoes when serving). - Roasted vegetables.  Can be eaten hot or cold.  And you can use frozen veg's to roast.     Goals: 1. Prepare a lot of vegetables (for leftovers) at least once weekly.   2. Do at least 10 minutes of some  kind of physical activity 3 days per week.       - Some of these times may be a 10-15-minute walk with Pilar Plate when he needs attention.       - Or it could be a short workout with DBs or body weight (situps, squats, etc).       - Set a phone timer for 2-3-minute breaks for exercise throughout the day.     Document # of minutes of exercise on your desk calendar.     Follow-up In-office appt on ***.

## 2022-03-19 DIAGNOSIS — K219 Gastro-esophageal reflux disease without esophagitis: Secondary | ICD-10-CM | POA: Diagnosis not present

## 2022-03-19 DIAGNOSIS — Z6837 Body mass index (BMI) 37.0-37.9, adult: Secondary | ICD-10-CM | POA: Diagnosis not present

## 2022-03-19 DIAGNOSIS — Z0000 Encounter for general adult medical examination without abnormal findings: Secondary | ICD-10-CM | POA: Diagnosis not present

## 2022-03-19 DIAGNOSIS — Z13220 Encounter for screening for lipoid disorders: Secondary | ICD-10-CM | POA: Diagnosis not present

## 2022-05-10 DIAGNOSIS — U071 COVID-19: Secondary | ICD-10-CM | POA: Diagnosis not present

## 2023-04-19 ENCOUNTER — Emergency Department (HOSPITAL_COMMUNITY)
Admission: EM | Admit: 2023-04-19 | Discharge: 2023-04-19 | Disposition: A | Discharge status: Home / Self Care | Payer: Managed Care, Other (non HMO) | Attending: Emergency Medicine | Admitting: Emergency Medicine

## 2023-04-19 DIAGNOSIS — R051 Acute cough: Secondary | ICD-10-CM | POA: Diagnosis not present

## 2023-04-19 DIAGNOSIS — R053 Chronic cough: Secondary | ICD-10-CM | POA: Diagnosis present

## 2023-04-19 NOTE — Discharge Instructions (Signed)
Please take antibiotic recently precribed, and follow up closely with your doctor for further care. Return if you have any concerns.

## 2023-04-19 NOTE — ED Triage Notes (Signed)
Pt reports having URI symptoms including cough, malaise, and chills for a week and a half. Spouse recently tested pos for RSV. Pt seen at [facility] and had test for pertussis done and started on amoxicillin abx. Pt denies any pain.

## 2023-04-19 NOTE — ED Provider Notes (Cosign Needed Addendum)
[place] EMERGENCY DEPARTMENT AT [facility] Provider Note   CSN: [id number] Arrival date & time: 04/19/23  1827     History  Chief Complaint  Patient presents with   Shortness of Breath   URI    Bradley Harrison is a 37 y.o. male.  The history is provided by the patient and medical records. No language interpreter was used.  Shortness of Breath URI    37 year old male here with cold sxs.  Pt report for the past 10 days he has had persistent cough, chills, fatigue, subjective fever.  Spouse recently test positive for RSV.  Pt sts he was seen at [facility] today and was prescribed amoxicillin antibiotic.  Pt however sts he checked his home O2 and it was 89%.  He denies wheezes, n/v/d.  No hx of PE/DVT.  No hemoptysis  Home Medications Prior to Admission medications   Medication Sig Start Date End Date Taking? Authorizing Provider  calcium carbonate (TUMS) 500 MG chewable tablet Chew 1 tablet by mouth 3 (three) times daily before meals.    [provider]  famotidine (PEPCID) 40 MG tablet Take 40 mg by mouth daily.    [provider]  famotidine (PEPCID) 40 MG tablet Take 1 tablet by mouth 2 (two) times daily.    [provider]  triamcinolone cream (KENALOG) 0.5 % 1 application to affected area 12/04/17   [provider]      Allergies    Patient has no known allergies.    Review of Systems   Review of Systems  Respiratory:  Positive for shortness of breath.   All other systems reviewed and are negative.   Physical Exam Updated Vital Signs BP 123/78 (BP Location: Right Arm)   Pulse 93   Temp 99.3 F (37.4 C)   Resp (!) 30   SpO2 94%  Physical Exam Vitals and nursing note reviewed.  Constitutional:      General: He is not in acute distress.    Appearance: He is well-developed.  HENT:     Head: Atraumatic.  Eyes:     Conjunctiva/sclera: Conjunctivae normal.  Cardiovascular:     Rate and Rhythm: Normal rate and regular  rhythm.     Pulses: Normal pulses.     Heart sounds: Normal heart sounds.  Pulmonary:     Effort: Pulmonary effort is normal.     Breath sounds: Normal breath sounds. No wheezing, rhonchi or rales.  Abdominal:     Palpations: Abdomen is soft.     Tenderness: There is no abdominal tenderness.  Musculoskeletal:     Cervical back: Neck supple.  Skin:    Findings: No rash.  Neurological:     Mental Status: He is alert.  Psychiatric:        Mood and Affect: Mood normal.     ED Results / Procedures / Treatments   Labs (all labs ordered are listed, but only abnormal results are displayed) Labs Reviewed - No data to display  EKG None  Radiology No results found.  Procedures Procedures    Medications Ordered in ED Medications - No data to display  ED Course/ Medical Decision Making/ A&P                                 Medical Decision Making  BP 123/78 (BP Location: Right Arm)   Pulse 93   Temp 99.3 F (  37.4 C)   Resp (!) 30   SpO2 94%   45:75 PM 37 year old male here with cold sxs.  Pt report for the past 10 days he has had persistent cough, chills, fatigue, subjective fever.  Spouse recently test positive for RSV.  Pt sts he was seen at [facility] today and was prescribed amoxicillin antibiotic.  Pt however sts he checked his home O2 and it was 89%.  He denies wheezes, n/v/d.  No hx of PE/DVT.  No hemoptysis  On exam pt is resting comfortable without acute sob.  Lungs CTAB.  Abd soft and nontender.    Vital sign overall reassuring. No hypoxia Discussed option of labs vs. Going home with abx. Pt amenable to go home and take meds recently prescribed today.   Return precaution given.  Low suspicion for PE.  No hx of asthma, and no wheezing, doubt asthma exacerbation.  Lungs are otherwise clear on exam.  Pt is PERC negative.          Final Clinical Impression(s) / ED Diagnoses Final diagnoses:  Acute cough    Rx / DC Orders ED Discharge Orders     None           Fayrene Helper, PA-C 04/19/23 Martyn Malay, MD 04/20/23 [phone]

## 2023-06-06 DIAGNOSIS — Z0000 Encounter for general adult medical examination without abnormal findings: Secondary | ICD-10-CM | POA: Diagnosis not present

## 2023-06-06 DIAGNOSIS — E7800 Pure hypercholesterolemia, unspecified: Secondary | ICD-10-CM | POA: Diagnosis not present

## 2023-07-28 DIAGNOSIS — F419 Anxiety disorder, unspecified: Secondary | ICD-10-CM | POA: Diagnosis not present

## 2023-08-05 DIAGNOSIS — F419 Anxiety disorder, unspecified: Secondary | ICD-10-CM | POA: Diagnosis not present

## 2023-08-13 DIAGNOSIS — F419 Anxiety disorder, unspecified: Secondary | ICD-10-CM | POA: Diagnosis not present

## 2023-08-20 DIAGNOSIS — R059 Cough, unspecified: Secondary | ICD-10-CM | POA: Diagnosis not present

## 2023-08-20 DIAGNOSIS — R0981 Nasal congestion: Secondary | ICD-10-CM | POA: Diagnosis not present

## 2023-08-20 DIAGNOSIS — J40 Bronchitis, not specified as acute or chronic: Secondary | ICD-10-CM | POA: Diagnosis not present

## 2023-08-20 DIAGNOSIS — J019 Acute sinusitis, unspecified: Secondary | ICD-10-CM | POA: Diagnosis not present

## 2023-08-25 DIAGNOSIS — F419 Anxiety disorder, unspecified: Secondary | ICD-10-CM | POA: Diagnosis not present

## 2023-09-02 DIAGNOSIS — F419 Anxiety disorder, unspecified: Secondary | ICD-10-CM | POA: Diagnosis not present

## 2023-09-08 DIAGNOSIS — F419 Anxiety disorder, unspecified: Secondary | ICD-10-CM | POA: Diagnosis not present

## 2023-09-16 DIAGNOSIS — R051 Acute cough: Secondary | ICD-10-CM | POA: Diagnosis not present
